# Patient Record
Sex: Female | Born: 1965 | Race: White | Hispanic: No | Smoking: Current every day smoker
Health system: Southern US, Community
[De-identification: ages and names within clinical notes are randomized; demographics above are authoritative.]

## PROBLEM LIST (undated history)

## (undated) DIAGNOSIS — F329 Major depressive disorder, single episode, unspecified: Secondary | ICD-10-CM

## (undated) DIAGNOSIS — J45909 Unspecified asthma, uncomplicated: Secondary | ICD-10-CM

## (undated) DIAGNOSIS — I10 Essential (primary) hypertension: Secondary | ICD-10-CM

## (undated) DIAGNOSIS — E119 Type 2 diabetes mellitus without complications: Secondary | ICD-10-CM

## (undated) DIAGNOSIS — F32A Depression, unspecified: Secondary | ICD-10-CM

## (undated) HISTORY — PX: TUBAL LIGATION: SHX77

## (undated) HISTORY — PX: APPENDECTOMY: SHX54

## (undated) HISTORY — PX: CHOLECYSTECTOMY: SHX55

---

## 2010-11-16 ENCOUNTER — Emergency Department (HOSPITAL_COMMUNITY)
Admission: EM | Admit: 2010-11-16 | Discharge: 2010-11-16 | Disposition: A | Discharge status: Home / Self Care | Payer: BC Managed Care – PPO | Attending: Emergency Medicine | Admitting: Emergency Medicine

## 2010-11-16 ENCOUNTER — Encounter: Payer: Self-pay | Admitting: *Deleted

## 2010-11-16 DIAGNOSIS — N390 Urinary tract infection, site not specified: Secondary | ICD-10-CM

## 2010-11-16 DIAGNOSIS — F17200 Nicotine dependence, unspecified, uncomplicated: Secondary | ICD-10-CM | POA: Insufficient documentation

## 2010-11-16 DIAGNOSIS — I10 Essential (primary) hypertension: Secondary | ICD-10-CM | POA: Insufficient documentation

## 2010-11-16 DIAGNOSIS — R51 Headache: Secondary | ICD-10-CM | POA: Insufficient documentation

## 2010-11-16 HISTORY — DX: Essential (primary) hypertension: I10

## 2010-11-16 LAB — URINALYSIS, ROUTINE W REFLEX MICROSCOPIC
Nitrite: NEGATIVE
Protein, ur: 30 mg/dL — AB
Specific Gravity, Urine: 1.025 (ref 1.005–1.030)
Urobilinogen, UA: 0.2 mg/dL (ref 0.0–1.0)

## 2010-11-16 LAB — URINE MICROSCOPIC-ADD ON

## 2010-11-16 MED ORDER — PHENAZOPYRIDINE HCL 200 MG PO TABS: 200.0000 mg | ORAL_TABLET | Freq: Three times a day (TID) | ORAL | Status: AC

## 2010-11-16 MED ORDER — CIPROFLOXACIN IN D5W 400 MG/200ML IV SOLN
400.0000 mg | Freq: Once | INTRAVENOUS | Status: AC
  Administered 2010-11-16: 400 mg via INTRAVENOUS
  Filled 2010-11-16: qty 200

## 2010-11-16 MED ORDER — PHENAZOPYRIDINE HCL 100 MG PO TABS
200.0000 mg | ORAL_TABLET | Freq: Once | ORAL | Status: AC
  Administered 2010-11-16: 200 mg via ORAL
  Filled 2010-11-16 (×2): qty 1

## 2010-11-16 MED ORDER — IBUPROFEN 400 MG PO TABS
600.0000 mg | ORAL_TABLET | Freq: Once | ORAL | Status: AC
  Administered 2010-11-16: 600 mg via ORAL
  Filled 2010-11-16: qty 2

## 2010-11-16 MED ORDER — CIPROFLOXACIN HCL 500 MG PO TABS: 500.0000 mg | ORAL_TABLET | Freq: Two times a day (BID) | ORAL | Status: AC

## 2010-11-16 NOTE — ED Notes (Signed)
Sudden onset of bladder pressure, urgency and bloody urine

## 2010-11-16 NOTE — ED Notes (Signed)
Pt left er stating no needs 

## 2010-11-16 NOTE — ED Provider Notes (Signed)
History     CSN: [id number] Arrival date & time: 11/16/2010  2:13 AM  Chief Complaint  Patient presents with  . Urinary Frequency   HPI Comments: Seen 0330. Patient with UTI symptoms that began day before yesterday. Worse last night.  Patient is a 45 y.o. female presenting with frequency. The history is provided by the patient.  Urinary Frequency This is a new problem. The current episode started 2 days ago. The problem occurs constantly. The problem has been gradually worsening. Associated symptoms include headaches. The symptoms are aggravated by nothing. The symptoms are relieved by nothing.    Past Medical History  Diagnosis Date  . Hypertension     Past Surgical History  Procedure Date  . Cholecystectomy   . Appendectomy   . Abdominal hysterectomy     No family history on file.  History  Substance Use Topics  . Smoking status: Current Everyday Smoker -- 1.0 packs/day  . Smokeless tobacco: Not on file  . Alcohol Use: No    OB History    Grav Para Term Preterm Abortions TAB SAB Ect Mult Living                  Review of Systems  Genitourinary: Positive for frequency.  Neurological: Positive for headaches.  All other systems reviewed and are negative.    Physical Exam  BP 143/91  Pulse 82  Temp(Src) 98.1 F (36.7 C) (Oral)  Resp 16  Ht 5\' 5"  (1.651 m)  Wt 190 lb (86.183 kg)  BMI 31.62 kg/m2  SpO2 97%  LMP 10/28/2010  Physical Exam  Vitals reviewed. Constitutional: She is oriented to person, place, and time. She appears well-developed and well-nourished.  HENT:  Head: Normocephalic and atraumatic.  Eyes: EOM are normal.  Cardiovascular: Normal rate, normal heart sounds and intact distal pulses.   Pulmonary/Chest: Effort normal and breath sounds normal.  Abdominal: Soft.  Musculoskeletal: Normal range of motion.  Neurological: She is alert and oriented to person, place, and time.  Skin: Skin is warm and dry.    ED Course   Procedures  Patient with UTI, started on antibiotics. Patient informed of clinical course, understand medical decision-making process, and agree with plan. MDM Reviewed: nursing note and vitals Interpretation: labs      Nicoletta Dress. Colon Branch, MD 11/16/10 [phone]

## 2012-03-25 ENCOUNTER — Encounter (HOSPITAL_COMMUNITY): Payer: Self-pay | Admitting: *Deleted

## 2012-03-25 ENCOUNTER — Emergency Department (HOSPITAL_COMMUNITY): Payer: BC Managed Care – PPO

## 2012-03-25 ENCOUNTER — Emergency Department (HOSPITAL_COMMUNITY)
Admission: EM | Admit: 2012-03-25 | Discharge: 2012-03-25 | Disposition: A | Discharge status: Home / Self Care | Payer: BC Managed Care – PPO | Attending: Emergency Medicine | Admitting: Emergency Medicine

## 2012-03-25 DIAGNOSIS — J9801 Acute bronchospasm: Secondary | ICD-10-CM

## 2012-03-25 DIAGNOSIS — J40 Bronchitis, not specified as acute or chronic: Secondary | ICD-10-CM

## 2012-03-25 DIAGNOSIS — Z79899 Other long term (current) drug therapy: Secondary | ICD-10-CM | POA: Insufficient documentation

## 2012-03-25 DIAGNOSIS — I10 Essential (primary) hypertension: Secondary | ICD-10-CM | POA: Insufficient documentation

## 2012-03-25 DIAGNOSIS — F329 Major depressive disorder, single episode, unspecified: Secondary | ICD-10-CM | POA: Insufficient documentation

## 2012-03-25 DIAGNOSIS — R062 Wheezing: Secondary | ICD-10-CM | POA: Insufficient documentation

## 2012-03-25 DIAGNOSIS — J209 Acute bronchitis, unspecified: Secondary | ICD-10-CM | POA: Insufficient documentation

## 2012-03-25 DIAGNOSIS — F17200 Nicotine dependence, unspecified, uncomplicated: Secondary | ICD-10-CM | POA: Insufficient documentation

## 2012-03-25 DIAGNOSIS — F3289 Other specified depressive episodes: Secondary | ICD-10-CM | POA: Insufficient documentation

## 2012-03-25 DIAGNOSIS — R059 Cough, unspecified: Secondary | ICD-10-CM | POA: Insufficient documentation

## 2012-03-25 DIAGNOSIS — R05 Cough: Secondary | ICD-10-CM

## 2012-03-25 HISTORY — DX: Depression, unspecified: F32.A

## 2012-03-25 HISTORY — DX: Major depressive disorder, single episode, unspecified: F32.9

## 2012-03-25 MED ORDER — BENZONATATE 100 MG PO CAPS
200.0000 mg | ORAL_CAPSULE | Freq: Once | ORAL | Status: AC
  Administered 2012-03-25: 200 mg via ORAL
  Filled 2012-03-25: qty 2

## 2012-03-25 MED ORDER — ALBUTEROL SULFATE (5 MG/ML) 0.5% IN NEBU
2.5000 mg | INHALATION_SOLUTION | Freq: Once | RESPIRATORY_TRACT | Status: AC
  Administered 2012-03-25: 2.5 mg via RESPIRATORY_TRACT
  Filled 2012-03-25: qty 0.5

## 2012-03-25 MED ORDER — PREDNISONE 10 MG PO TABS: 20.0000 mg | ORAL_TABLET | Freq: Every day | ORAL | Status: DC

## 2012-03-25 MED ORDER — GUAIFENESIN-CODEINE 100-10 MG/5ML PO SOLN
5.0000 mL | Freq: Once | ORAL | Status: AC
  Administered 2012-03-25: 5 mL via ORAL
  Filled 2012-03-25: qty 5

## 2012-03-25 MED ORDER — AZITHROMYCIN 250 MG PO TABS: ORAL_TABLET | ORAL | Status: DC

## 2012-03-25 MED ORDER — IPRATROPIUM BROMIDE 0.02 % IN SOLN
0.5000 mg | Freq: Once | RESPIRATORY_TRACT | Status: AC
  Administered 2012-03-25: 0.5 mg via RESPIRATORY_TRACT
  Filled 2012-03-25: qty 2.5

## 2012-03-25 MED ORDER — ALBUTEROL SULFATE HFA 108 (90 BASE) MCG/ACT IN AERS: 1.0000 | INHALATION_SPRAY | Freq: Four times a day (QID) | RESPIRATORY_TRACT | Status: DC | PRN

## 2012-03-25 MED ORDER — DEXTROMETHORPHAN HBR 15 MG/5ML PO SYRP: 10.0000 mL | ORAL_SOLUTION | Freq: Four times a day (QID) | ORAL | Status: DC | PRN

## 2012-03-25 MED ORDER — PREDNISONE 50 MG PO TABS
60.0000 mg | ORAL_TABLET | Freq: Once | ORAL | Status: AC
  Administered 2012-03-25: 60 mg via ORAL
  Filled 2012-03-25: qty 1

## 2012-03-25 MED ORDER — AZITHROMYCIN 250 MG PO TABS
500.0000 mg | ORAL_TABLET | Freq: Once | ORAL | Status: AC
  Administered 2012-03-25: 500 mg via ORAL
  Filled 2012-03-25: qty 2

## 2012-03-25 NOTE — ED Provider Notes (Signed)
History     CSN: [id number]  Arrival date & time 03/25/12  0243   First MD Initiated Contact with Patient 03/25/12 [phone]      Chief Complaint  Patient presents with  . Shortness of Breath    (Consider location/radiation/quality/duration/timing/severity/associated sxs/prior treatment) HPI Krista Bowen is a 47 y.o. female who presents to the Emergency Department complaining of increasing shortness of breath associated with a cough for the last three days. She has used albuterol left over from a previous URI with no improvement. Tonight she became more short of breath associated with wheezing. She tried steam in the bathroom, went outside in the cold air neither of which made her breathing better. Denies fever, chills, nausea, vomiting.   PCP Dr. Loney Hering Past Medical History  Diagnosis Date  . Hypertension   . Depression     Past Surgical History  Procedure Date  . Cholecystectomy   . Appendectomy   . Tubal ligation     History reviewed. No pertinent family history.  History  Substance Use Topics  . Smoking status: Current Every Day Smoker -- 1.0 packs/day  . Smokeless tobacco: Not on file  . Alcohol Use: No    OB History    Grav Para Term Preterm Abortions TAB SAB Ect Mult Living                  Review of Systems  Constitutional: Negative for fever.       10 Systems reviewed and are negative for acute change except as noted in the HPI.  HENT: Negative for congestion.   Eyes: Negative for discharge and redness.  Respiratory: Positive for cough, shortness of breath and wheezing.   Cardiovascular: Negative for chest pain.  Gastrointestinal: Negative for vomiting and abdominal pain.  Musculoskeletal: Negative for back pain.  Skin: Negative for rash.  Neurological: Negative for syncope, numbness and headaches.  Psychiatric/Behavioral:       No behavior change.    Allergies  Review of patient's allergies indicates no known allergies.  Home Medications    Current Outpatient Rx  Name  Route  Sig  Dispense  Refill  . LOSARTAN POTASSIUM-HCTZ 100-25 MG PO TABS   Oral   Take 1 tablet by mouth daily.         Marland Kitchen ALPRAZOLAM 0.5 MG PO TABS   Oral   Take 0.5 mg by mouth at bedtime as needed.           . SERTRALINE HCL 100 MG PO TABS   Oral   Take 100 mg by mouth daily.             BP 132/73  Pulse 97  Temp 98.4 F (36.9 C) (Oral)  Resp 20  Ht 5\' 5"  (1.651 m)  Wt 192 lb (87.091 kg)  BMI 31.95 kg/m2  SpO2 96%  LMP 02/23/2012  Physical Exam  Nursing note and vitals reviewed. Constitutional:       Awake, alert, nontoxic appearance.  HENT:  Head: Atraumatic.  Eyes: Right eye exhibits no discharge. Left eye exhibits no discharge.  Neck: Neck supple.  Pulmonary/Chest: She has wheezes. She exhibits no tenderness.       Increased effort, unable to talk in complete sentences.  Abdominal: Soft. There is no tenderness. There is no rebound.  Musculoskeletal: She exhibits no tenderness.       Baseline ROM, no obvious new focal weakness.  Neurological:       Mental status and motor strength appears baseline  for patient and situation.  Skin: No rash noted.  Psychiatric: She has a normal mood and affect.    ED Course  Procedures (including critical care time)  Dg Chest 2 View  03/25/2012  *RADIOLOGY REPORT*  Clinical Data: Cough and shortness of breath for 3 days.  CHEST - 2 VIEW  Comparison: 12/19/2011  Findings: Interval development of linear opacity in the left mid lung suggesting linear atelectasis.  Peribronchial thickening suggesting chronic bronchitis.  Mild hyperinflation. The heart size and pulmonary vascularity are normal. The lungs appear clear and expanded without focal air space disease or consolidation. No blunting of the costophrenic angles.  IMPRESSION: Developing linear atelectasis in the left mid lung.   Original Report Authenticated By: Burman Nieves, M.D.     MDM  Patient with cough and SOB x 3 days. Xray  without acute findings however supporting bronchospasm. Given nebulizer treatments x 2, prednisone, cough medicine and tessalon pereles with improvement. Occasional cough. Residual cough. Reviewed results.  Pt feels improved after observation and/or treatment in ED.Pt stable in ED with no significant deterioration in condition.The patient appears reasonably screened and/or stabilized for discharge and I doubt any other medical condition or other [facility] requiring further screening, evaluation, or treatment in the ED at this time prior to discharge.  MDM Reviewed: nursing note and vitals Interpretation: x-ray         Nicoletta Dress. Colon Branch, MD 03/25/12 [phone]

## 2012-03-25 NOTE — ED Notes (Signed)
Pt reporting cough and SOB for 3 days.  Denies cough being productive.  Reports taking OTC cold medication, as well as an inhaler with no relief.

## 2012-03-25 NOTE — ED Notes (Signed)
Pt discharged. Pt stable at time of discharge. Medications reviewed pt has no questions regarding discharge at this time. Pt voiced understanding of discharge instructions.  

## 2016-03-02 ENCOUNTER — Encounter (HOSPITAL_COMMUNITY): Payer: Self-pay

## 2016-03-02 ENCOUNTER — Emergency Department (HOSPITAL_COMMUNITY)
Admission: EM | Admit: 2016-03-02 | Discharge: 2016-03-02 | Disposition: A | Discharge status: Home / Self Care | Payer: BLUE CROSS/BLUE SHIELD | Attending: Emergency Medicine | Admitting: Emergency Medicine

## 2016-03-02 DIAGNOSIS — Z79899 Other long term (current) drug therapy: Secondary | ICD-10-CM | POA: Diagnosis not present

## 2016-03-02 DIAGNOSIS — L500 Allergic urticaria: Secondary | ICD-10-CM | POA: Diagnosis not present

## 2016-03-02 DIAGNOSIS — T7840XD Allergy, unspecified, subsequent encounter: Secondary | ICD-10-CM

## 2016-03-02 DIAGNOSIS — I10 Essential (primary) hypertension: Secondary | ICD-10-CM | POA: Insufficient documentation

## 2016-03-02 DIAGNOSIS — L509 Urticaria, unspecified: Secondary | ICD-10-CM

## 2016-03-02 DIAGNOSIS — T7840XA Allergy, unspecified, initial encounter: Secondary | ICD-10-CM | POA: Diagnosis present

## 2016-03-02 DIAGNOSIS — F17200 Nicotine dependence, unspecified, uncomplicated: Secondary | ICD-10-CM | POA: Diagnosis not present

## 2016-03-02 NOTE — ED Triage Notes (Signed)
Patient reports of itching, swelling to hands and woke up with difficulty breathing this morning at 0300. Went to urgent care had breathing treatment, solumedrol and 50mg  of benadryl po. Patient Denies respiratory issues at this time. Patient has been on Augmentin for 10 days.

## 2016-03-02 NOTE — ED Notes (Addendum)
Pt left without discharge papers. EDP reviewed discharged and education. IV access. No VS taken prior to leaving.

## 2016-03-02 NOTE — ED Notes (Signed)
ED Provider at bedside. 

## 2016-03-04 NOTE — ED Provider Notes (Signed)
[facility]   CSN: [id number] Arrival date & time: 03/02/16  1315     History   Chief Complaint Chief Complaint  Patient presents with  . Allergic Reaction    HPI Krista CroftBarbara Bowen is a 50 y.o. female.  HPI   50yF with allergic reaction. She woke up around 3:00 this morning with diffuse itching, mild swelling her hands and face and some difficulty breathing. She went to urgent care and was given a breathing treatment, Solu-Medrol and Benadryl. She was referred to the emergency room for further evaluation. Since being seen at [facility], her symptoms have significantly improved. Mild facial swelling. Rash dyspnea/wheezing. No further gi complaints. No dizziness or lightheadedness.   Past Medical History:  Diagnosis Date  . Depression   . Hypertension     There are no active problems to display for this patient.   Past Surgical History:  Procedure Laterality Date  . APPENDECTOMY    . CHOLECYSTECTOMY    . TUBAL LIGATION      OB History    No data available       Home Medications    Prior to Admission medications   Medication Sig Start Date End Date Taking? Authorizing Provider  albuterol (PROVENTIL HFA;VENTOLIN HFA) 108 (90 BASE) MCG/ACT inhaler Inhale 1-2 puffs into the lungs every 6 (six) hours as needed for wheezing. 03/25/12   Annamarie Dawleyerry S Strand, MD  ALPRAZolam Prudy Feeler(XANAX) 0.5 MG tablet Take 0.5 mg by mouth at bedtime as needed.      Historical Provider, MD  azithromycin (ZITHROMAX Z-PAK) 250 MG tablet One Po daily 03/25/12   Annamarie Dawleyerry S Strand, MD  dextromethorphan 15 MG/5ML syrup Take 10 mLs (30 mg total) by mouth 4 (four) times daily as needed for cough. 03/25/12   Annamarie Dawleyerry S Strand, MD  losartan-hydrochlorothiazide (HYZAAR) 100-25 MG per tablet Take 1 tablet by mouth daily.    Historical Provider, MD  predniSONE (DELTASONE) 10 MG tablet Take 2 tablets (20 mg total) by mouth daily. 03/25/12   Annamarie Dawleyerry S Strand, MD  sertraline (ZOLOFT) 100 MG tablet Take 100 mg by  mouth daily.      Historical Provider, MD    Family History No family history on file.  Social History Social History  Substance Use Topics  . Smoking status: Current Every Day Smoker    Packs/day: 1.00  . Smokeless tobacco: Never Used  . Alcohol use No     Allergies   Patient has no known allergies.   Review of Systems Review of Systems  All systems reviewed and negative, other than as noted in HPI.  Physical Exam Updated Vital Signs BP 117/79 (BP Location: Left Arm)   Pulse 100   Temp 97.8 F (36.6 C) (Oral)   Resp 18   Ht 5\' 5"  (1.651 m)   Wt 200 lb (90.7 kg)   LMP 03/02/2016 (LMP Unknown)   SpO2 97%   BMI 33.28 kg/m   Physical Exam  Constitutional: She is oriented to person, place, and time. She appears well-developed and well-nourished. No distress.  HENT:  Head: Normocephalic and atraumatic.  Mild facial puffiness. Normal sounding voice. No stridor.   Eyes: Conjunctivae are normal. Pupils are equal, round, and reactive to light. Right eye exhibits no discharge. Left eye exhibits no discharge.  Neck: Neck supple.  Cardiovascular: Normal rate, regular rhythm and normal heart sounds.  Exam reveals no gallop and no friction rub.   No murmur heard. Pulmonary/Chest: Effort normal and breath sounds  normal. No respiratory distress.  Abdominal: Soft. She exhibits no distension. There is no tenderness.  Musculoskeletal: She exhibits no edema or tenderness.  Neurological: She is alert and oriented to person, place, and time.  Skin: Skin is warm and dry. No rash noted.  Psychiatric: She has a normal mood and affect. Her behavior is normal. Thought content normal.  Nursing note and vitals reviewed.    ED Treatments / Results  Labs (all labs ordered are listed, but only abnormal results are displayed) Labs Reviewed - No data to display  EKG  EKG Interpretation None       Radiology No results found.  Procedures Procedures (including critical care  time)  Medications Ordered in ED Medications - No data to display   Initial Impression / Assessment and Plan / ED Course  I have reviewed the triage vital signs and the nursing notes.  Pertinent labs & imaging results that were available during my care of the patient were reviewed by me and considered in my medical decision making (see chart for details).  Clinical Course    50 year old female wling. Se she has no acute respiratory complaints. Some mild nausea previously which has since resolved.en in urgent care prior to arrival to the emergency room. She reports that her symptoms have significantly improved since then.On exam she has no acute distress. Her lungs are clear no increased work of breathing. She is hemodynamically stable. At this point I feel she is appropriate for discharge.  . As needed Benadryl.   Final Clinical Impressions(s) / ED Diagnoses   Final diagnoses:  Allergic reaction, subsequent encounter  Urticaria    New Prescriptions Discharge Medication List as of 03/02/2016  2:43 PM       Raeford RazorStephen Robbin Escher, MD 03/04/16 1511

## 2019-03-31 ENCOUNTER — Other Ambulatory Visit: Payer: Self-pay

## 2019-03-31 ENCOUNTER — Emergency Department (HOSPITAL_COMMUNITY): Payer: BC Managed Care – PPO

## 2019-03-31 ENCOUNTER — Encounter (HOSPITAL_COMMUNITY): Payer: Self-pay | Admitting: *Deleted

## 2019-03-31 ENCOUNTER — Ambulatory Visit (INDEPENDENT_AMBULATORY_CARE_PROVIDER_SITE_OTHER)
Admission: EM | Admit: 2019-03-31 | Discharge: 2019-03-31 | Disposition: A | Discharge status: Home / Self Care | Payer: BC Managed Care – PPO | Source: Home / Self Care

## 2019-03-31 ENCOUNTER — Emergency Department (HOSPITAL_COMMUNITY)
Admission: EM | Admit: 2019-03-31 | Discharge: 2019-03-31 | Disposition: A | Discharge status: Home / Self Care | Payer: BC Managed Care – PPO | Attending: Emergency Medicine | Admitting: Emergency Medicine

## 2019-03-31 DIAGNOSIS — R110 Nausea: Secondary | ICD-10-CM | POA: Insufficient documentation

## 2019-03-31 DIAGNOSIS — R519 Headache, unspecified: Secondary | ICD-10-CM

## 2019-03-31 DIAGNOSIS — G43901 Migraine, unspecified, not intractable, with status migrainosus: Secondary | ICD-10-CM | POA: Diagnosis not present

## 2019-03-31 DIAGNOSIS — Z7984 Long term (current) use of oral hypoglycemic drugs: Secondary | ICD-10-CM | POA: Diagnosis not present

## 2019-03-31 DIAGNOSIS — R030 Elevated blood-pressure reading, without diagnosis of hypertension: Secondary | ICD-10-CM

## 2019-03-31 DIAGNOSIS — I10 Essential (primary) hypertension: Secondary | ICD-10-CM | POA: Diagnosis not present

## 2019-03-31 DIAGNOSIS — R739 Hyperglycemia, unspecified: Secondary | ICD-10-CM

## 2019-03-31 DIAGNOSIS — F17210 Nicotine dependence, cigarettes, uncomplicated: Secondary | ICD-10-CM | POA: Diagnosis not present

## 2019-03-31 DIAGNOSIS — Z79899 Other long term (current) drug therapy: Secondary | ICD-10-CM | POA: Insufficient documentation

## 2019-03-31 LAB — BASIC METABOLIC PANEL
Anion gap: 10 (ref 5–15)
BUN: 15 mg/dL (ref 6–20)
CO2: 24 mmol/L (ref 22–32)
Calcium: 9.4 mg/dL (ref 8.9–10.3)
Chloride: 100 mmol/L (ref 98–111)
Creatinine, Ser: 0.8 mg/dL (ref 0.44–1.00)
GFR calc Af Amer: >60 mL/min (ref >60)
GFR calc non Af Amer: >60 mL/min (ref >60)
Glucose, Bld: 161 mg/dL — ABNORMAL HIGH (ref 70–99)
Potassium: 3.9 mmol/L (ref 3.5–5.1)
Sodium: 134 mmol/L — ABNORMAL LOW (ref 135–145)

## 2019-03-31 LAB — CBC
HCT: 45.7 % (ref 36.0–46.0)
Hemoglobin: 15.4 g/dL — ABNORMAL HIGH (ref 12.0–15.0)
MCH: 30.7 pg (ref 26.0–34.0)
MCHC: 33.7 g/dL (ref 30.0–36.0)
MCV: 91.2 fL (ref 80.0–100.0)
Platelets: 216 10*3/uL (ref 150–400)
RBC: 5.01 MIL/uL (ref 3.87–5.11)
RDW: 13.2 % (ref 11.5–15.5)
WBC: 7.3 10*3/uL (ref 4.0–10.5)
nRBC: 0 % (ref 0.0–0.2)

## 2019-03-31 MED ORDER — MORPHINE SULFATE (PF) 4 MG/ML IV SOLN
4.0000 mg | Freq: Once | INTRAVENOUS | Status: AC
  Administered 2019-03-31: 4 mg via INTRAVENOUS
  Filled 2019-03-31: qty 1

## 2019-03-31 MED ORDER — DIPHENHYDRAMINE HCL 50 MG/ML IJ SOLN
12.5000 mg | Freq: Once | INTRAMUSCULAR | Status: AC
  Administered 2019-03-31: 12.5 mg via INTRAVENOUS
  Filled 2019-03-31: qty 1

## 2019-03-31 MED ORDER — ACETAMINOPHEN 325 MG PO TABS
650.0000 mg | ORAL_TABLET | Freq: Once | ORAL | Status: AC
  Administered 2019-03-31: 08:00:00 650 mg via ORAL

## 2019-03-31 MED ORDER — BUTALBITAL-APAP-CAFFEINE 50-325-40 MG PO TABS: 1.0000 | ORAL_TABLET | Freq: Four times a day (QID) | ORAL | 0 refills | Status: AC | PRN

## 2019-03-31 MED ORDER — PROCHLORPERAZINE EDISYLATE 10 MG/2ML IJ SOLN
10.0000 mg | Freq: Once | INTRAMUSCULAR | Status: AC
  Administered 2019-03-31: 09:00:00 10 mg via INTRAVENOUS
  Filled 2019-03-31: qty 2

## 2019-03-31 MED ORDER — ONDANSETRON 4 MG PO TBDP
4.0000 mg | ORAL_TABLET | Freq: Once | ORAL | Status: AC
  Administered 2019-03-31: 08:00:00 4 mg via ORAL

## 2019-03-31 NOTE — ED Triage Notes (Signed)
Pt presents with c/o headache. Pt states she has had headache since the 1st of January but became severe in ;ast 3 days and radiating to left face and left side of neck

## 2019-03-31 NOTE — ED Triage Notes (Signed)
Patient presents to the ED after urgent care evaluation of headache and hypertension.  Patient complains of left side headache with nausea for 3 days.  Patient states she has taken at least 30  200mg  tablets of motrin in addition to "4 asprins" in the last 24 hours.

## 2019-03-31 NOTE — Discharge Instructions (Signed)
Recommending further evaluation and management in the ED for worst headache of life. Cannot rule out brain bleed in Urgent Care setting. Patient and family member aware.  Will travel by private vehicle to [facility] ED.  Patient given in tylenol 650 mg and zofran 4 mg.  Patient discharged in stable condition.

## 2019-03-31 NOTE — ED Provider Notes (Signed)
[facility]   [id number] 03/31/19 Arrival Time: 0803  CC: HEADACHE  SUBJECTIVE:  Krista Bowen is a 54 y.o. female who complains of worst headache of life.  Has had a headache/ migraine x 1 month, but worsened over the past 3 days.  Denies a precipitating event, or recent head trauma.  Patient localizes her pain to the LT side of face.  Describes the pain as constant and aching in character.  9/10.  Patient has tried taking "30 ibuprofen" since 3 am this morning without relief. Denies aggravating factors.  This is the worst headache of their life.  Complains of associated nausea.  Patient denies fever, chills, night sweats, vomiting, vision changes, diplopia, peripheral vision deficits, chest pain, SOB, abdominal pain, weakness, numbness or tingling, slurred speech, facial droop.    ROS: As per HPI.  All other pertinent ROS negative.     Past Medical History:  Diagnosis Date  . Depression   . Hypertension    Past Surgical History:  Procedure Laterality Date  . APPENDECTOMY    . CHOLECYSTECTOMY    . TUBAL LIGATION     Allergies  Allergen Reactions  . Augmentin [Amoxicillin-Pot Clavulanate] Hives   No current facility-administered medications on file prior to encounter.   Current Outpatient Medications on File Prior to Encounter  Medication Sig Dispense Refill  . albuterol (PROVENTIL HFA;VENTOLIN HFA) 108 (90 BASE) MCG/ACT inhaler Inhale 1-2 puffs into the lungs every 6 (six) hours as needed for wheezing. 1 Inhaler 0  . ALPRAZolam (XANAX) 0.5 MG tablet Take 0.5 mg by mouth at bedtime as needed.      Marland Kitchen dextromethorphan 15 MG/5ML syrup Take 10 mLs (30 mg total) by mouth 4 (four) times daily as needed for cough. 120 mL 0  . losartan-hydrochlorothiazide (HYZAAR) 100-25 MG per tablet Take 1 tablet by mouth daily.    . metFORMIN (GLUCOPHAGE) 500 MG tablet Take 500 mg by mouth 2 (two) times daily.    . sertraline (ZOLOFT) 100 MG tablet Take 100 mg by mouth daily.        Social History   Socioeconomic History  . Marital status: Married    Spouse name: Not on file  . Number of children: Not on file  . Years of education: Not on file  . Highest education level: Not on file  Occupational History  . Not on file  Tobacco Use  . Smoking status: Current Every Day Smoker    Packs/day: 1.00  . Smokeless tobacco: Never Used  Substance and Sexual Activity  . Alcohol use: No  . Drug use: No  . Sexual activity: Not on file  Other Topics Concern  . Not on file  Social History Narrative  . Not on file   Social Determinants of Health   Financial Resource Strain:   . Difficulty of Paying Living Expenses: Not on file  Food Insecurity:   . Worried About Programme researcher, broadcasting/film/video in the Last Year: Not on file  . Ran Out of Food in the Last Year: Not on file  Transportation Needs:   . Lack of Transportation (Medical): Not on file  . Lack of Transportation (Non-Medical): Not on file  Physical Activity:   . Days of Exercise per Week: Not on file  . Minutes of Exercise per Session: Not on file  Stress:   . Feeling of Stress : Not on file  Social Connections:   . Frequency of Communication with Friends and Family: Not on file  .  Frequency of Social Gatherings with Friends and Family: Not on file  . Attends Religious Services: Not on file  . Active Member of Clubs or Organizations: Not on file  . Attends Archivist Meetings: Not on file  . Marital Status: Not on file  Intimate Partner Violence:   . Fear of Current or Ex-Partner: Not on file  . Emotionally Abused: Not on file  . Physically Abused: Not on file  . Sexually Abused: Not on file   Family History  Problem Relation Age of Onset  . Diabetes Mother   . Hypertension Mother   . Stroke Mother   . Hypertension Father   . Stroke Father   . Heart failure Father     OBJECTIVE:  Vitals:   03/31/19 0814  BP: (!) 150/102  Pulse: 83  Temp: 98.4 F (36.9 C)  SpO2: 97%    General  appearance: alert; appears uncomfortable Eyes: PERRLA; EOMI HENT: normocephalic; atraumatic Neck: supple with FROM Lungs: clear to auscultation bilaterally Heart: regular rate and rhythm.   Extremities: no edema; symmetrical with no gross deformities Skin: warm and dry Neurologic: CN 2-12 grossly intact; finger to nose without difficulty; normal gait; strength and sensation intact bilaterally about the upper and lower extremities; negative pronator drift Psychological: alert and cooperative; normal mood and affect  ASSESSMENT & PLAN:  1. Worst headache of life   2. Elevated blood pressure reading     Meds ordered this encounter  Medications  . acetaminophen (TYLENOL) tablet 650 mg  . ondansetron (ZOFRAN-ODT) disintegrating tablet 4 mg   Recommending further evaluation and management in the ED for worst headache of life. Cannot rule out brain bleed in Urgent Care setting. Patient and family member aware.  Will travel by private vehicle to [facility] ED.  Patient given in tylenol 650 mg and zofran 4 mg.  Patient discharged in stable condition.     Stacey Drain Acequia, PA-C 03/31/19 [phone]

## 2019-03-31 NOTE — Discharge Instructions (Addendum)
Take the medications as needed for recurrent headache.  Follow-up with your primary care doctor as we discussed.  Return to the ED for worsening symptoms, numbness, weakness, or slurred speech

## 2019-03-31 NOTE — ED Provider Notes (Signed)
[facility] EMERGENCY DEPARTMENT Provider Note   CSN: [id number] Arrival date & time: 03/31/19  6237     History Chief Complaint  Patient presents with  . Migraine    Krista Bowen is a 54 y.o. female.  HPI   Patient presents ED for evaluation of a headache.  Patient states this has been going off and on for the past month.  No acute thunderclap onset but the symptoms have been gradually getting more severe.  In the last day or so it has been more intense and is now a 9 out of 10 in severity.  She has not had headaches this bad before.  She has been taking ibuprofen, more than recommended, she thinks maybe 30.  The headache is on the left side of her eye goes down the left side of her face to neck.  She denies having any numbness or weakness.  No trouble with her speech.  No fevers or chills.  She has had some nausea with this.  Patient does have history of migraines but has not had one like this in a while.  She went to an urgent care today who suggested she come to the ED.  Past Medical History:  Diagnosis Date  . Depression   . Hypertension     There are no problems to display for this patient.   Past Surgical History:  Procedure Laterality Date  . APPENDECTOMY    . CHOLECYSTECTOMY    . TUBAL LIGATION       OB History   No obstetric history on file.     Family History  Problem Relation Age of Onset  . Diabetes Mother   . Hypertension Mother   . Stroke Mother   . Hypertension Father   . Stroke Father   . Heart failure Father     Social History   Tobacco Use  . Smoking status: Current Every Day Smoker    Packs/day: 1.00  . Smokeless tobacco: Never Used  Substance Use Topics  . Alcohol use: No  . Drug use: No    Home Medications Prior to Admission medications   Medication Sig Start Date End Date Taking? Authorizing Provider  albuterol (PROVENTIL HFA;VENTOLIN HFA) 108 (90 BASE) MCG/ACT inhaler Inhale 1-2 puffs into the lungs every 6 (six) hours as  needed for wheezing. 03/25/12   Annamarie Dawley, MD  ALPRAZolam Prudy Feeler) 0.5 MG tablet Take 0.5 mg by mouth at bedtime as needed.      [provider]  butalbital-acetaminophen-caffeine (FIORICET) [phone] MG tablet Take 1-2 tablets by mouth every 6 (six) hours as needed for headache. 03/31/19 03/30/20  Linwood Dibbles, MD  dextromethorphan 15 MG/5ML syrup Take 10 mLs (30 mg total) by mouth 4 (four) times daily as needed for cough. Patient not taking: Reported on 03/31/2019 03/25/12   Annamarie Dawley, MD  glimepiride (AMARYL) 4 MG tablet Take 4 mg by mouth 2 (two) times daily. 03/15/19   [provider]  losartan-hydrochlorothiazide (HYZAAR) 100-25 MG per tablet Take 1 tablet by mouth daily.    [provider]  metFORMIN (GLUCOPHAGE-XR) 500 MG 24 hr tablet Take 1,000 mg by mouth 2 (two) times daily. 03/15/19   [provider]  potassium chloride SA (KLOR-CON) 20 MEQ tablet Take 20 mEq by mouth 2 (two) times daily. 03/15/19   [provider]  RYBELSUS 7 MG TABS Take 1 tablet by mouth every morning. 03/07/19   [provider]  sertraline (ZOLOFT) 100 MG tablet Take  100 mg by mouth daily.      [provider]    Allergies    Augmentin [amoxicillin-pot clavulanate]  Review of Systems   Review of Systems  All other systems reviewed and are negative.   Physical Exam Updated Vital Signs BP (!) 166/77 (BP Location: Right Arm)   Pulse 72   Temp 98.1 F (36.7 C) (Oral)   Resp 14   Ht 1.626 m (5\' 4" )   Wt 83.5 kg   LMP 03/02/2016 (LMP Unknown)   SpO2 100%   BMI 31.58 kg/m   Physical Exam Vitals and nursing note reviewed.  Constitutional:      General: She is not in acute distress.    Appearance: She is well-developed.  HENT:     Head: Normocephalic and atraumatic.     Right Ear: Tympanic membrane and external ear normal.     Left Ear: Tympanic membrane and external ear normal.  Eyes:     General: No visual field deficit or scleral  icterus.       Right eye: No discharge.        Left eye: No discharge.     Conjunctiva/sclera: Conjunctivae normal.  Neck:     Trachea: No tracheal deviation.  Cardiovascular:     Rate and Rhythm: Normal rate and regular rhythm.  Pulmonary:     Effort: Pulmonary effort is normal. No respiratory distress.     Breath sounds: Normal breath sounds. No stridor. No wheezing or rales.  Abdominal:     General: Bowel sounds are normal. There is no distension.     Palpations: Abdomen is soft.     Tenderness: There is no abdominal tenderness. There is no guarding or rebound.  Musculoskeletal:        General: No tenderness.     Cervical back: Normal range of motion and neck supple. No rigidity.  Skin:    General: Skin is warm and dry.     Findings: No rash.  Neurological:     Mental Status: She is alert.     Cranial Nerves: No cranial nerve deficit (no facial droop, extraocular movements intact, no slurred speech).     Sensory: No sensory deficit.     Motor: Motor function is intact. No weakness, abnormal muscle tone or seizure activity.     Coordination: Coordination normal.     Comments: No pronator drift, able to lift both legs off the bed     ED Results / Procedures / Treatments   Labs (all labs ordered are listed, but only abnormal results are displayed) Labs Reviewed  CBC - Abnormal; Notable for the following components:      Result Value   Hemoglobin 15.4 (*)    All other components within normal limits  BASIC METABOLIC PANEL - Abnormal; Notable for the following components:   Sodium 134 (*)    Glucose, Bld 161 (*)    All other components within normal limits    EKG None  Radiology CT Head Wo Contrast  Result Date: 03/31/2019 CLINICAL DATA:  Headache rule out hemorrhage EXAM: CT HEAD WITHOUT CONTRAST TECHNIQUE: Contiguous axial images were obtained from the base of the skull through the vertex without intravenous contrast. COMPARISON:  None. FINDINGS: Brain: No evidence  of acute infarction, hemorrhage, hydrocephalus, extra-axial collection or mass lesion/mass effect. Vascular: Negative for hyperdense vessel Skull: Negative Sinuses/Orbits: Negative Other: None IMPRESSION: Negative CT head Electronically Signed   By: Franchot Gallo M.D.   On: 03/31/2019 09:43  Procedures Procedures (including critical care time)  Medications Ordered in ED Medications  prochlorperazine (COMPAZINE) injection 10 mg (10 mg Intravenous Given 03/31/19 0915)  morphine 4 MG/ML injection 4 mg (4 mg Intravenous Given 03/31/19 0918)  diphenhydrAMINE (BENADRYL) injection 12.5 mg (12.5 mg Intravenous Given 03/31/19 0914)    ED Course  I have reviewed the triage vital signs and the nursing notes.  Pertinent labs & imaging results that were available during my care of the patient were reviewed by me and considered in my medical decision making (see chart for details).  Clinical Course as of Mar 30 1010  Sun Mar 31, 2019  1001 Labs reviewed.  CT scan scan without acute findings.  Hyperglycemia noted   [JK]  1012 Patient is feeling better now after treatment   [JK]    Clinical Course User Index [JK] Linwood Dibbles, MD   MDM Rules/Calculators/A&P                      Symptoms are consistent with a migraine headache.  No thunderclap onset.  Head CT is negative.  No signs of hemorrhage.  I doubt subarachnoid hemorrhage..  Will discharge home.  Warning signs and precautions discussed.  Discussed outpatient follow-up.  H Patient does have history of migraines.  She improved with treatment in the ED Final Clinical Impression(s) / ED Diagnoses Final diagnoses:  Hyperglycemia  Migraine with status migrainosus, not intractable, unspecified migraine type    Rx / DC Orders ED Discharge Orders         Ordered    butalbital-acetaminophen-caffeine (FIORICET) 50-325-40 MG tablet  Every 6 hours PRN     03/31/19 1007           Linwood Dibbles, MD 03/31/19 1013

## 2020-02-07 ENCOUNTER — Other Ambulatory Visit: Payer: Self-pay | Admitting: Internal Medicine

## 2020-02-07 DIAGNOSIS — Z1231 Encounter for screening mammogram for malignant neoplasm of breast: Secondary | ICD-10-CM

## 2020-02-10 ENCOUNTER — Other Ambulatory Visit: Payer: Self-pay

## 2020-02-10 ENCOUNTER — Ambulatory Visit
Admission: RE | Admit: 2020-02-10 | Discharge: 2020-02-10 | Disposition: A | Discharge status: Home / Self Care | Payer: BC Managed Care – PPO | Source: Ambulatory Visit | Attending: Internal Medicine | Admitting: Internal Medicine

## 2020-02-10 DIAGNOSIS — Z1231 Encounter for screening mammogram for malignant neoplasm of breast: Secondary | ICD-10-CM

## 2020-07-30 IMAGING — CT CT HEAD W/O CM
3 series · 16 of 47 positions shown, 19 images · non-contrast
Comparison: None.

CLINICAL DATA: Headache rule out hemorrhage

EXAM:
CT HEAD WITHOUT CONTRAST
TECHNIQUE: Contiguous axial images were obtained from the base of the skull
through the vertex without intravenous contrast.

[Series 2: head w o · axial · 0.46mm/px · z∈[-5,+120]mm · 10 of 30 slices shown, 13 images]
[im 3/30  brain]
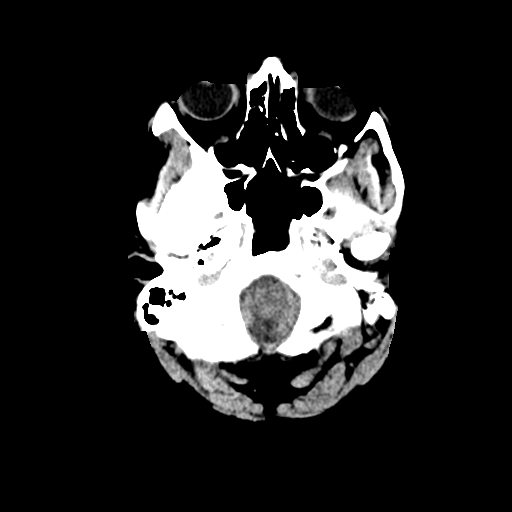
[im 3/30  bone]
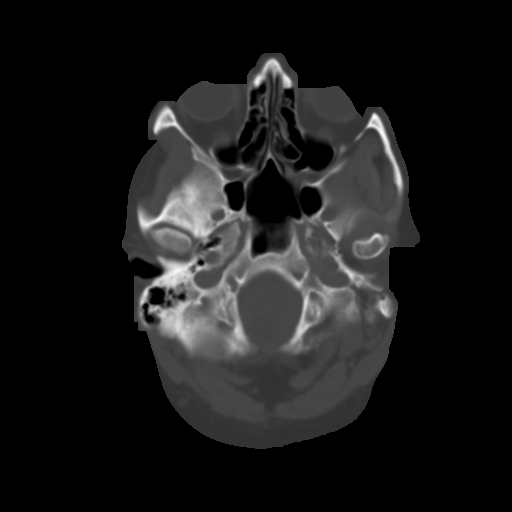
[im 6/30  brain]
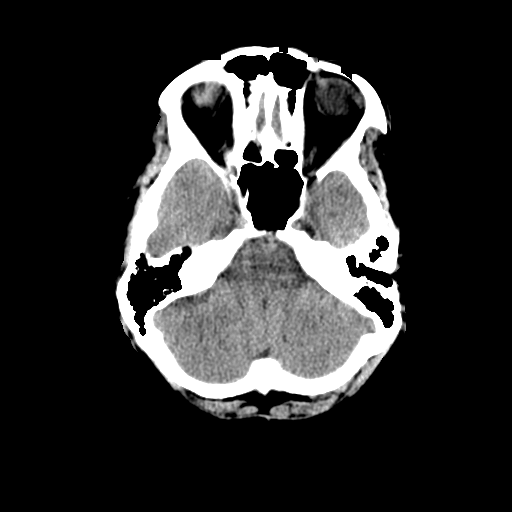
[im 9/30  brain]
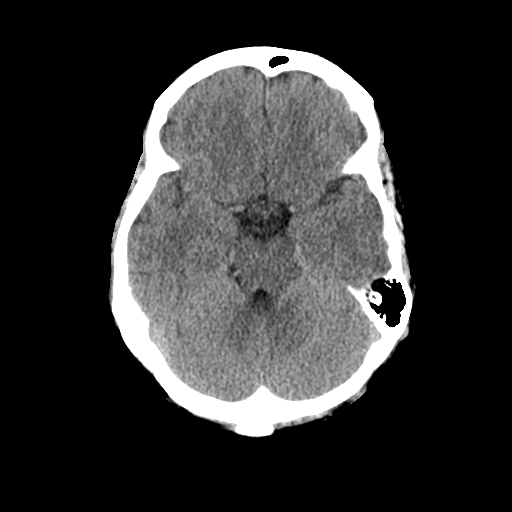
[im 11/30  brain]
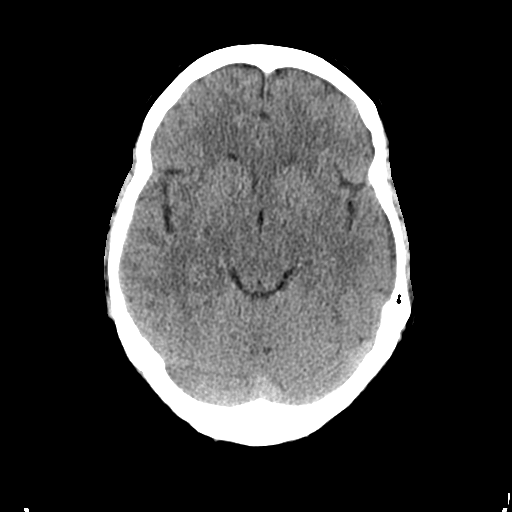
[im 14/30  brain]
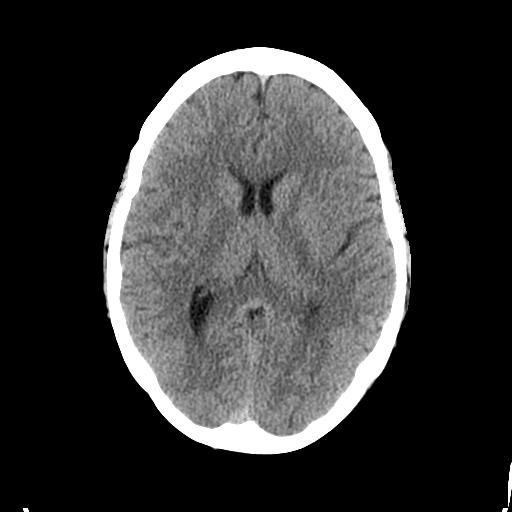
[im 14/30  bone]
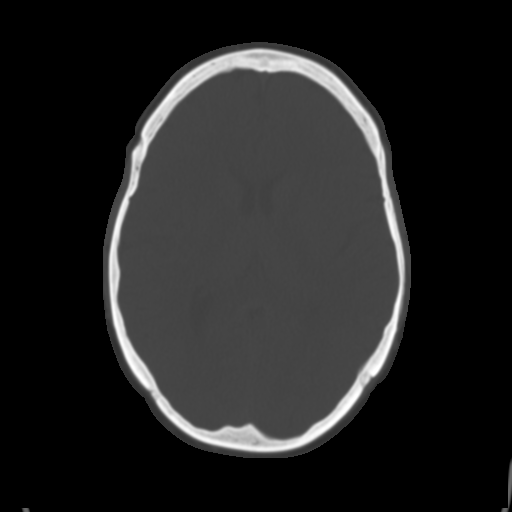
[im 17/30  brain]
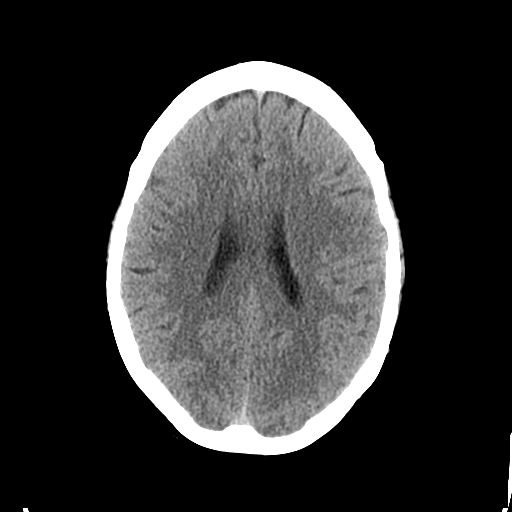
[im 20/30  brain]
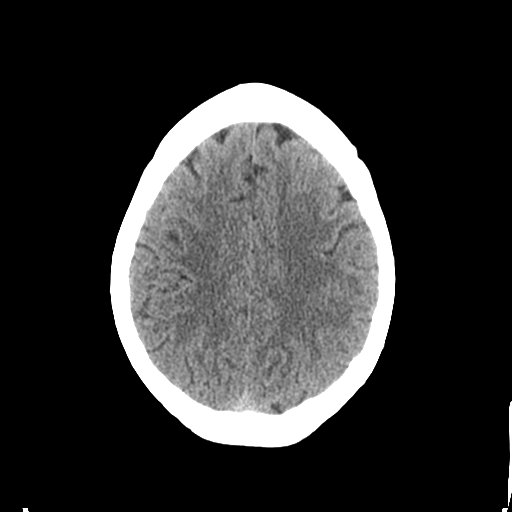
[im 23/30  brain]
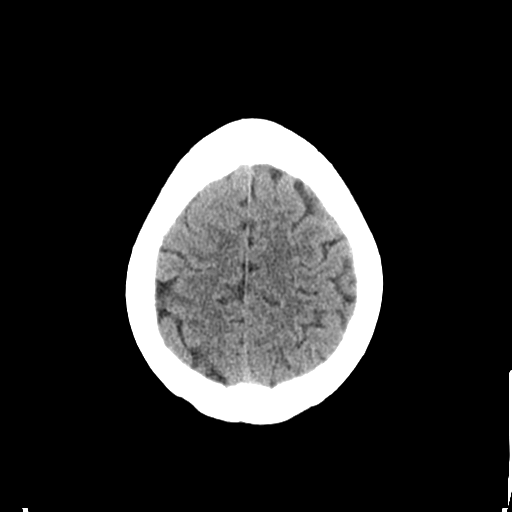
[im 25/30  brain]
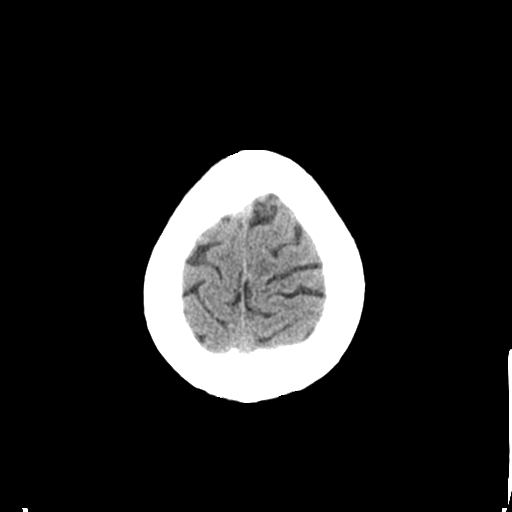
[im 25/30  bone]
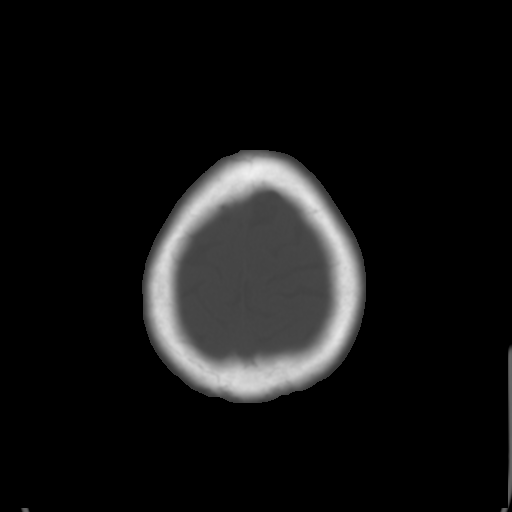
[im 28/30  brain]
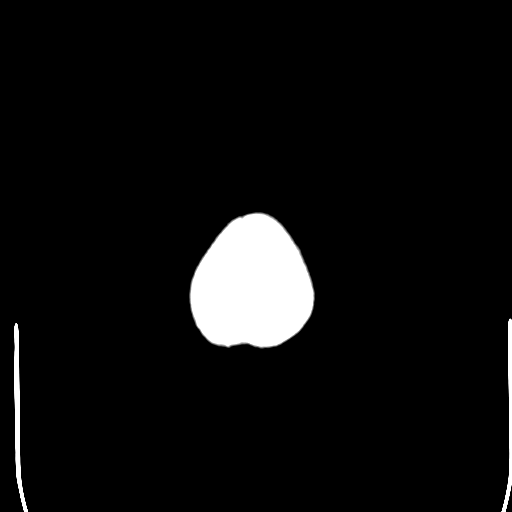

[Series 4: coronal soft · coronal · 0.33mm/px · 3 of 68 slices shown]
[im 23/68  brain]
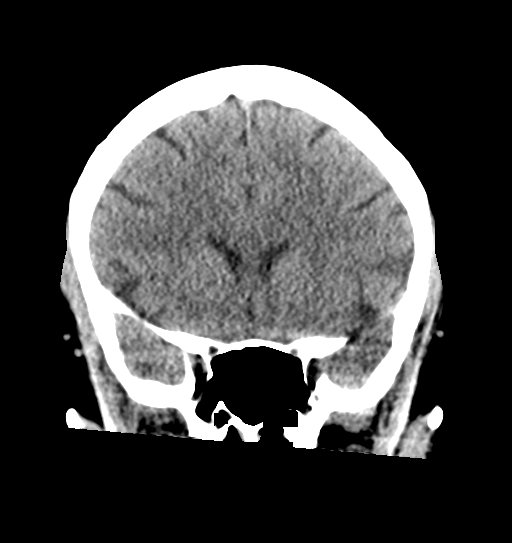
[im 30/68  brain]
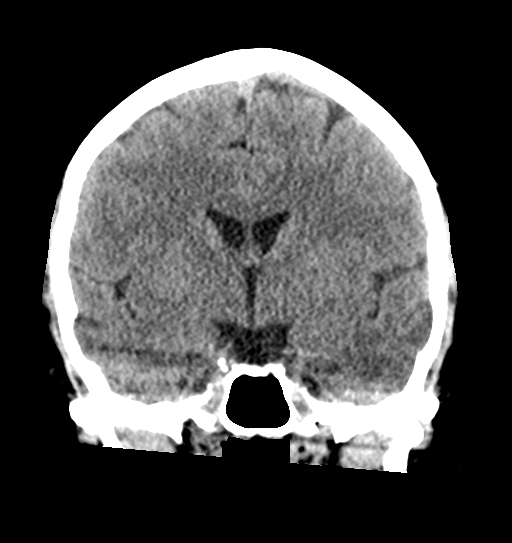
[im 38/68  brain]
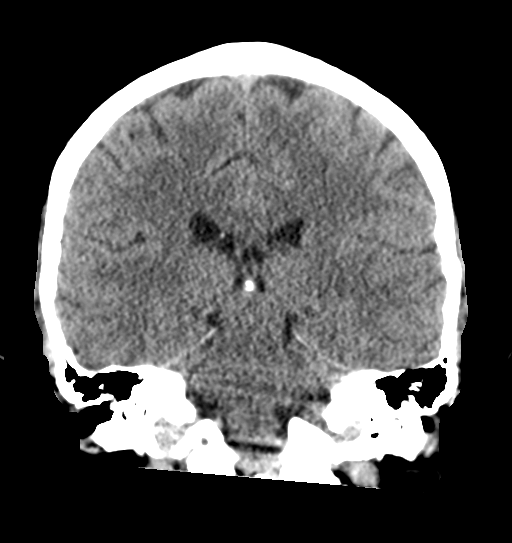

[Series 5: sagittal soft · sagittal · 0.34mm/px · 3 of 56 slices shown]
[im 19/56  brain]
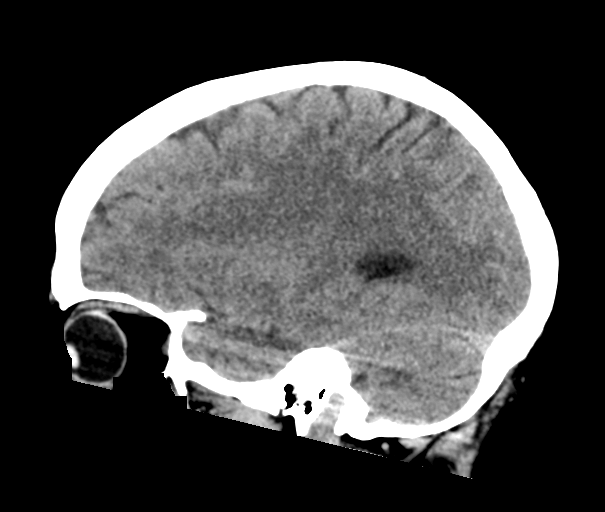
[im 28/56  brain]
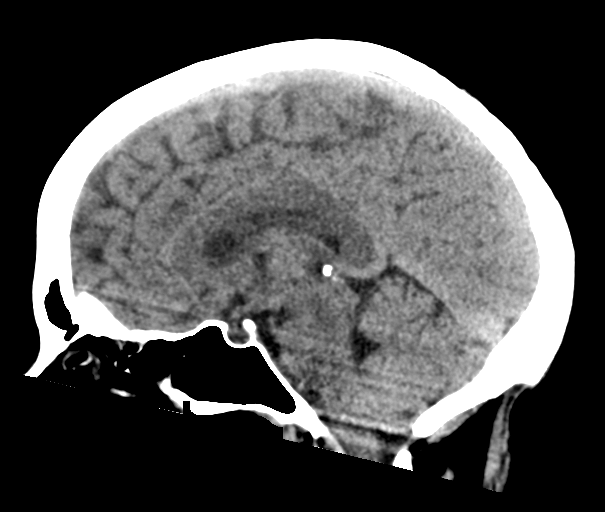
[im 37/56  brain]
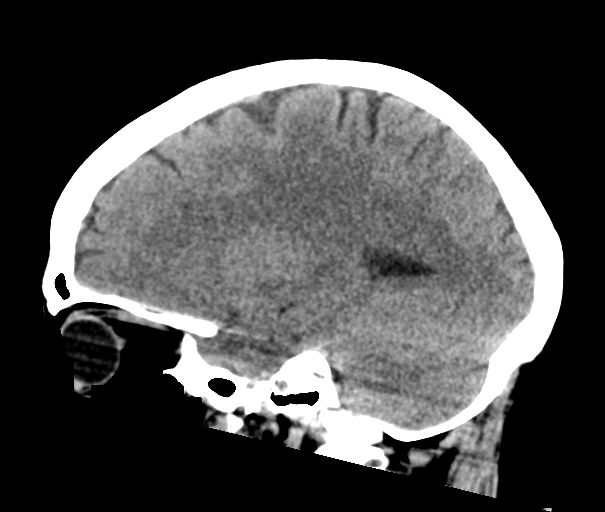

[16 of 47 positions shown; findings below may reference images not displayed]

FINDINGS: Brain: No evidence of acute infarction, hemorrhage, hydrocephalus,
extra-axial collection or mass lesion/mass effect.

Vascular: Negative for hyperdense vessel

Skull: Negative

Sinuses/Orbits: Negative

Other: None
IMPRESSION: Negative CT head

## 2020-12-28 ENCOUNTER — Ambulatory Visit: Payer: BC Managed Care – PPO | Admitting: Dermatology

## 2021-06-23 ENCOUNTER — Telehealth: Payer: Self-pay | Admitting: Family Medicine

## 2021-06-23 NOTE — Telephone Encounter (Signed)
Pt is asking for exception as new patient, please review and advise.  ? ?

## 2021-06-23 NOTE — Telephone Encounter (Signed)
Krista Bowen pt of [facility]iddle called in asking if she would be willing to take her mother in law on as new pt, please advise and call M[facility] to set up an appt.  ? ? ?

## 2021-07-14 NOTE — Telephone Encounter (Signed)
LM making pt aware of this asked her to call back to schedule an appt  ?

## 2021-07-14 NOTE — Telephone Encounter (Signed)
Can you accept this pt?  ?

## 2021-07-14 NOTE — Telephone Encounter (Signed)
Ok to establish 

## 2021-08-06 ENCOUNTER — Ambulatory Visit: Payer: Self-pay | Admitting: Family Medicine

## 2023-02-08 ENCOUNTER — Emergency Department (HOSPITAL_COMMUNITY): Payer: BC Managed Care – PPO

## 2023-02-08 ENCOUNTER — Emergency Department (HOSPITAL_COMMUNITY)
Admission: EM | Admit: 2023-02-08 | Discharge: 2023-02-08 | Disposition: A | Discharge status: Home / Self Care | Payer: BC Managed Care – PPO | Attending: Emergency Medicine | Admitting: Emergency Medicine

## 2023-02-08 ENCOUNTER — Other Ambulatory Visit: Payer: Self-pay

## 2023-02-08 ENCOUNTER — Encounter (HOSPITAL_COMMUNITY): Payer: Self-pay | Admitting: *Deleted

## 2023-02-08 DIAGNOSIS — H53149 Visual discomfort, unspecified: Secondary | ICD-10-CM | POA: Diagnosis not present

## 2023-02-08 DIAGNOSIS — Z79899 Other long term (current) drug therapy: Secondary | ICD-10-CM | POA: Diagnosis not present

## 2023-02-08 DIAGNOSIS — H9203 Otalgia, bilateral: Secondary | ICD-10-CM | POA: Diagnosis not present

## 2023-02-08 DIAGNOSIS — R110 Nausea: Secondary | ICD-10-CM | POA: Insufficient documentation

## 2023-02-08 DIAGNOSIS — I10 Essential (primary) hypertension: Secondary | ICD-10-CM | POA: Diagnosis not present

## 2023-02-08 DIAGNOSIS — E119 Type 2 diabetes mellitus without complications: Secondary | ICD-10-CM | POA: Diagnosis not present

## 2023-02-08 DIAGNOSIS — R519 Headache, unspecified: Secondary | ICD-10-CM | POA: Diagnosis present

## 2023-02-08 DIAGNOSIS — M542 Cervicalgia: Secondary | ICD-10-CM | POA: Insufficient documentation

## 2023-02-08 HISTORY — DX: Unspecified asthma, uncomplicated: J45.909

## 2023-02-08 HISTORY — DX: Type 2 diabetes mellitus without complications: E11.9

## 2023-02-08 LAB — COMPREHENSIVE METABOLIC PANEL
ALT: 21 U/L (ref 0–44)
AST: 15 U/L (ref 15–41)
Albumin: 4.4 g/dL (ref 3.5–5.0)
Alkaline Phosphatase: 71 U/L (ref 38–126)
Anion gap: 11 (ref 5–15)
BUN: 25 mg/dL — ABNORMAL HIGH (ref 6–20)
CO2: 23 mmol/L (ref 22–32)
Calcium: 9.5 mg/dL (ref 8.9–10.3)
Chloride: 101 mmol/L (ref 98–111)
Creatinine, Ser: 0.89 mg/dL (ref 0.44–1.00)
GFR, Estimated: >60 mL/min (ref >60)
Glucose, Bld: 208 mg/dL — ABNORMAL HIGH (ref 70–99)
Potassium: 4 mmol/L (ref 3.5–5.1)
Sodium: 135 mmol/L (ref 135–145)
Total Bilirubin: 0.7 mg/dL (ref <1.2)
Total Protein: 7.2 g/dL (ref 6.5–8.1)

## 2023-02-08 LAB — CBC WITH DIFFERENTIAL/PLATELET
Abs Immature Granulocytes: 0.02 10*3/uL (ref 0.00–0.07)
Basophils Absolute: 0.1 10*3/uL (ref 0.0–0.1)
Basophils Relative: 1 %
Eosinophils Absolute: 0.1 10*3/uL (ref 0.0–0.5)
Eosinophils Relative: 2 %
HCT: 42.2 % (ref 36.0–46.0)
Hemoglobin: 14.4 g/dL (ref 12.0–15.0)
Immature Granulocytes: 0 %
Lymphocytes Relative: 36 %
Lymphs Abs: 2.7 10*3/uL (ref 0.7–4.0)
MCH: 30.1 pg (ref 26.0–34.0)
MCHC: 34.1 g/dL (ref 30.0–36.0)
MCV: 88.1 fL (ref 80.0–100.0)
Monocytes Absolute: 0.4 10*3/uL (ref 0.1–1.0)
Monocytes Relative: 5 %
Neutro Abs: 4.2 10*3/uL (ref 1.7–7.7)
Neutrophils Relative %: 56 %
Platelets: 210 10*3/uL (ref 150–400)
RBC: 4.79 MIL/uL (ref 3.87–5.11)
RDW: 12.3 % (ref 11.5–15.5)
WBC: 7.4 10*3/uL (ref 4.0–10.5)
nRBC: 0 % (ref 0.0–0.2)

## 2023-02-08 MED ORDER — MORPHINE SULFATE (PF) 4 MG/ML IV SOLN
4.0000 mg | Freq: Once | INTRAVENOUS | Status: AC
  Administered 2023-02-08: 4 mg via INTRAVENOUS
  Filled 2023-02-08: qty 1

## 2023-02-08 MED ORDER — SODIUM CHLORIDE 0.9 % IV BOLUS
1000.0000 mL | Freq: Once | INTRAVENOUS | Status: AC
  Administered 2023-02-08: 1000 mL via INTRAVENOUS

## 2023-02-08 MED ORDER — MAGNESIUM SULFATE 2 GM/50ML IV SOLN
2.0000 g | Freq: Once | INTRAVENOUS | Status: AC
  Administered 2023-02-08: 2 g via INTRAVENOUS
  Filled 2023-02-08: qty 50

## 2023-02-08 MED ORDER — METOCLOPRAMIDE HCL 5 MG/ML IJ SOLN
10.0000 mg | Freq: Once | INTRAMUSCULAR | Status: DC
  Filled 2023-02-08: qty 2

## 2023-02-08 MED ORDER — IOHEXOL 300 MG/ML  SOLN
75.0000 mL | Freq: Once | INTRAMUSCULAR | Status: AC | PRN
  Administered 2023-02-08: 75 mL via INTRAVENOUS

## 2023-02-08 MED ORDER — DIPHENHYDRAMINE HCL 50 MG/ML IJ SOLN
25.0000 mg | Freq: Once | INTRAMUSCULAR | Status: AC
  Administered 2023-02-08: 25 mg via INTRAVENOUS
  Filled 2023-02-08: qty 1

## 2023-02-08 MED ORDER — IOHEXOL 350 MG/ML SOLN
75.0000 mL | Freq: Once | INTRAVENOUS | Status: AC | PRN
  Administered 2023-02-08: 75 mL via INTRAVENOUS

## 2023-02-08 NOTE — ED Triage Notes (Signed)
Pt states she has had a headache since October 26th and she has been following her PCP for a migraine; pt has been given medication for migraine but she says it has not been helping  Pt states she has been having sharp pains running up the back of her neck on the right side  Pt states she has been having pain to her left ear and pain under her left eye

## 2023-02-08 NOTE — ED Provider Notes (Signed)
[place] EMERGENCY DEPARTMENT AT [facility] Provider Note   CSN: [id number] Arrival date & time: 02/08/23  1445     History  No chief complaint on file.   Krista Bowen is a 57 y.o. female, history of hypertension, diabetes, who presents to the ED secondary to severe headache, this been going on for the last 2 months.  She states it started October 26, and has only went away for a few hours and immediately came back.  She states that it started on her left side of her head, with associated nausea, photophobia and phonophobia.  She went to the ER and she was given a headache cocktail which made it go away for a few hours, but then it came back.  Went to her primary care doctor, Si Gaul was given oral migraine medicine, as well as a referral to neurologist.  She states that she cannot tolerate the pain anymore, and this time is on the right side of her neck, and then her head, and as she has some eye pain, as well as ear pain.  Notes that she is not have any kind of changes in vision, difficulty speech, or weakness in one-sided the body.  No history of migraines.  Home Medications Prior to Admission medications   Medication Sig Start Date End Date Taking? Authorizing Provider  ALPRAZolam (XANAX) 0.25 MG tablet Take 0.25 mg by mouth at bedtime. 01/11/23  Yes [provider]  aspirin-acetaminophen-caffeine (EXCEDRIN MIGRAINE) [phone] MG tablet Take 1 tablet by mouth every 6 (six) hours as needed for headache.   Yes [provider]  diphenhydrAMINE (BENADRYL) 25 mg capsule Take 25 mg by mouth every 6 (six) hours as needed for sleep.   Yes [provider]  guaiFENesin (MUCINEX) 600 MG 12 hr tablet Take 600 mg by mouth 2 (two) times daily.   Yes [provider]  ibuprofen (ADVIL) 100 MG tablet Take 100 mg by mouth every 6 (six) hours as needed for fever.   Yes [provider]  losartan-hydrochlorothiazide (HYZAAR) 100-25 MG per tablet Take 0.5  tablets by mouth daily.   Yes [provider]  MOUNJARO 5 MG/0.5ML Pen Inject 5 mg into the skin once a week.   Yes [provider]  potassium chloride SA (KLOR-CON) 20 MEQ tablet Take 20 mEq by mouth daily. 03/15/19  Yes [provider]  sertraline (ZOLOFT) 100 MG tablet Take 100 mg by mouth daily.     Yes [provider]      Allergies    Amoxicillin-pot clavulanate    Review of Systems   Review of Systems  Neurological:  Positive for headaches. Negative for speech difficulty.    Physical Exam Updated Vital Signs BP 127/65   Pulse 71   Temp 98 F (36.7 C) (Oral)   Resp 14   Ht 5\' 4"  (1.626 m)   Wt 68 kg   LMP 03/02/2016 (LMP Unknown)   SpO2 98%   BMI 25.75 kg/m  Physical Exam Vitals and nursing note reviewed.  Constitutional:      General: She is not in acute distress.    Appearance: She is well-developed.  HENT:     Head: Normocephalic and atraumatic.     Comments: Tenderness to palpation of sinuses    Right Ear: Tympanic membrane normal.     Left Ear: Tympanic membrane normal.     Nose: Nose normal.     Mouth/Throat:     Mouth: Mucous membranes are moist.  Eyes:     Conjunctiva/sclera: Conjunctivae normal.  Cardiovascular:     Rate and Rhythm: Normal rate and regular rhythm.     Heart sounds: No murmur heard. Pulmonary:     Effort: Pulmonary effort is normal. No respiratory distress.     Breath sounds: Normal breath sounds.  Abdominal:     Palpations: Abdomen is soft.     Tenderness: There is no abdominal tenderness.  Musculoskeletal:        General: No swelling.     Cervical back: Neck supple.  Skin:    General: Skin is warm and dry.     Capillary Refill: Capillary refill takes less than 2 seconds.  Neurological:     General: No focal deficit present.     Mental Status: She is alert and oriented to person, place, and time.     Cranial Nerves: No cranial nerve deficit.     Sensory: No sensory deficit.     Motor: No  weakness.  Psychiatric:        Mood and Affect: Mood normal.     ED Results / Procedures / Treatments   Labs (all labs ordered are listed, but only abnormal results are displayed) Labs Reviewed  COMPREHENSIVE METABOLIC PANEL - Abnormal; Notable for the following components:      Result Value   Glucose, Bld 208 (*)    BUN 25 (*)    All other components within normal limits  CBC WITH DIFFERENTIAL/PLATELET    EKG None  Radiology CT VENOGRAM HEAD  Result Date: 02/08/2023 CLINICAL DATA:  New onset headache EXAM: CT VENOGRAM HEAD TECHNIQUE: Venographic phase images of the brain were obtained following the administration of intravenous contrast. Multiplanar reformats and maximum intensity projections were generated. RADIATION DOSE REDUCTION: This exam was performed according to the departmental dose-optimization program which includes automated exposure control, adjustment of the mA and/or kV according to patient size and/or use of iterative reconstruction technique. CONTRAST:  75mL OMNIPAQUE IOHEXOL 300 MG/ML  SOLN COMPARISON:  None Available. FINDINGS: Superior sagittal sinus: Normal. Straight sinus: Normal. Inferior sagittal sinus, vein of Galen and internal cerebral veins: Normal. Transverse sinuses: Normal. Sigmoid sinuses: Normal. Visualized jugular veins: Normal. IMPRESSION: Normal CT venogram of the head. Electronically Signed   By: Deatra Robinson M.D.   On: 02/08/2023 20:33   CT ANGIO HEAD NECK W WO CM  Result Date: 02/08/2023 CLINICAL DATA:  Severe headache over the last 2 months. Unknown etiology. EXAM: CT ANGIOGRAPHY HEAD AND NECK WITH AND WITHOUT CONTRAST TECHNIQUE: Multidetector CT imaging of the head and neck was performed using the standard protocol during bolus administration of intravenous contrast. Multiplanar CT image reconstructions and MIPs were obtained to evaluate the vascular anatomy. Carotid stenosis measurements (when applicable) are obtained utilizing NASCET criteria,  using the distal internal carotid diameter as the denominator. RADIATION DOSE REDUCTION: This exam was performed according to the departmental dose-optimization program which includes automated exposure control, adjustment of the mA and/or kV according to patient size and/or use of iterative reconstruction technique. CONTRAST:  75mL OMNIPAQUE IOHEXOL 350 MG/ML SOLN COMPARISON:  Head CT 03/31/2019 FINDINGS: CT HEAD FINDINGS Brain: The brain shows a normal appearance without evidence of malformation, atrophy, old or acute [place] Moring or large vessel infarction, mass lesion, hemorrhage, hydrocephalus or extra-axial collection. Vascular: No hyperdense vessel. No evidence of atherosclerotic calcification. Skull: Normal.  No traumatic finding.  No focal bone lesion. Sinuses/Orbits: Sinuses are clear. Orbits appear normal. Mastoids are clear. Other: None significant CTA NECK FINDINGS  Aortic arch: Aortic atherosclerosis. Branching pattern is normal without origin stenosis. Right carotid system: Common carotid artery widely patent to the bifurcation. Carotid bifurcation is normal without soft or calcified plaque. No stenosis. Cervical ICA is mildly tortuous but widely patent. Left carotid system: Left carotid system similarly normal other than mild tortuosity. Vertebral arteries: Both vertebral artery origins are widely patent. Both vessels appear normal through the cervical region to the foramen magnum. Skeleton: Ordinary mid cervical spondylosis. No evidence of craniocervical junction degenerative change. Other neck: No mass or lymphadenopathy. Upper chest: Lung apices are clear. Review of the MIP images confirms the above findings CTA HEAD FINDINGS Anterior circulation: Both internal carotid arteries are patent through the skull base and siphon regions. There is mild siphon atherosclerotic calcification but no stenosis. The anterior and middle cerebral vessels are patent. No occlusion or stenosis. No aneurysm or vascular  malformation. Posterior circulation: Both vertebral arteries widely patent to the basilar artery. No basilar stenosis. Posterior circulation branch vessels are normal. Venous sinuses: Patent and normal. Anatomic variants: None significant. Review of the MIP images confirms the above findings IMPRESSION: 1. Normal head CT. 2. Aortic atherosclerosis. 3. No carotid bifurcation disease. Mild tortuosity of the cervical ICAs bilaterally, which can be seen in chronic hypertension. 4. No evidence of aneurysm or other cause of headache. Aortic Atherosclerosis (ICD10-I70.0). Electronically Signed   By: Paulina Fusi M.D.   On: 02/08/2023 18:43    Procedures Procedures    Medications Ordered in ED Medications  metoCLOPramide (REGLAN) injection 10 mg (0 mg Intravenous Hold 02/08/23 1651)  sodium chloride 0.9 % bolus 1,000 mL (1,000 mLs Intravenous Bolus 02/08/23 1636)  diphenhydrAMINE (BENADRYL) injection 25 mg (25 mg Intravenous Given 02/08/23 1637)  magnesium sulfate IVPB 2 g 50 mL (0 g Intravenous Stopped 02/08/23 1823)  iohexol (OMNIPAQUE) 350 MG/ML injection 75 mL (75 mLs Intravenous Contrast Given 02/08/23 1803)  morphine (PF) 4 MG/ML injection 4 mg (4 mg Intravenous Given 02/08/23 2018)  iohexol (OMNIPAQUE) 300 MG/ML solution 75 mL (75 mLs Intravenous Contrast Given 02/08/23 1927)    ED Course/ Medical Decision Making/ A&P                                 Medical Decision Making Patient is a 57 year old female, here for severe headache that is been going on since October 26.  She has associated phonophobia photophobia and nausea.  This continually comes back.  States she is post to go to her neurologist, who cannot tolerate the pain anymore.  Also reports some ear pain.  Ears look good, there is no evidence of kind of any kind of infection.  She does have a little bit of tenderness to palpation of the sinuses however she is not presenting like a sinus infection.  No nasal congestion, or fevers.  We will  obtain a CTA head/neck and CT venogram, for further evaluation.  Will try migraine cocktail and see if there is any relief.  She has no focal neurodeficits on my exam.  Which is reassuring.  Amount and/or Complexity of Data Reviewed Labs: ordered.    Details: Unremarkable Radiology: ordered.    Details: CTA head/neck, and CTV head unremarkable Discussion of management or test interpretation with external provider(s): Discussed with patient, after multiple rounds of medications, she is finally feeling little bit better, magnesium, and fluids did not help, but morphine did.  Her imaging is reassuring, there is no evidence  of a thrombus, or aneurysm present.  I advised her she will need to follow-up with neurology for further evaluation, and she voiced understanding.  She may be having some pressure in her ears, as secondary to changes in the atmosphere, from the weather.  I recommended she start Zyrtec and Flonase for this.  Risk Prescription drug management.   Final Clinical Impression(s) / ED Diagnoses Final diagnoses:  Acute nonintractable headache, unspecified headache type  Otalgia of both ears    Rx / DC Orders ED Discharge Orders     None         [place]ha Doster, Harley [place], PA 02/08/23 2142    Linwood Dibbles, MD 02/09/23 1002

## 2023-02-08 NOTE — ED Notes (Signed)
PA aware of headache still and requests nonrebreather at 15 lpm for cluster migraine tx.

## 2023-02-08 NOTE — Discharge Instructions (Signed)
Please follow-up with neurology as instructed, the cause of your headache is unclear.  Your CAT scans were reassuring however.  Please return to the ER if you have any weakness on one side of the body, slurring of the speech, or numbness or tingling.  Additionally the cause of your earache is unclear, and may be secondary to pressure, from the seizure that seasons.  I recommend that you take some Zyrtec, and Flonase, to help with your symptoms.  Return to the ER if you feel like your symptoms are worsening

## 2023-02-20 ENCOUNTER — Encounter: Payer: Self-pay | Admitting: Neurology

## 2023-06-16 NOTE — Progress Notes (Deleted)
 NEUROLOGY CONSULTATION NOTE  Krista Bowen MRN: [id number] DOB: [birth year]  Referring provider: Gilman Schmidt, NP Primary care provider: Gilman Schmidt, NP  Reason for consult:  headache  Assessment/Plan:   ***   Subjective:  Krista Bowen is a 58 year old ***-handed female with HTN, DM 2, asthma and depression who presents for headaches.  History supplemented by ED and referring provider's notes.  CTA and CTV personally reviewed.  Onset:  12/11/2022 Location:  left retro-orbital Quality:  burning  Intensity:  ***.   Aura:  *** Prodrome:  *** Associated symptoms:  photophobia, allodynia of eye.  She denies associated unilateral numbness or weakness. Duration:  *** Frequency:  *** Frequency of abortive medication: *** Triggers:  *** Relieving factors:  *** Activity:  ***  Seen in the ED twice on 01/11/2023 and 02/08/2023.  CT/CTV head and CTA head and neck from 02/08/2023 showed mild tortuosity of the cervical ICAs bilaterally but otherwise no acute intracranial abnormality, venous thrombosis, aneurysm or other vascular malformation.    Past NSAIDS/analgesics:  Excedrin Mgraine Past abortive triptans:  *** Past abortive ergotamine:  *** Past muscle relaxants:  *** Past anti-emetic:  *** Past antihypertensive medications:  *** Past antidepressant medications:  *** Past anticonvulsant medications:  *** Past anti-CGRP:  *** Past vitamins/Herbal/Supplements:  *** Past antihistamines/decongestants:  *** Other past therapies:  ***  Current NSAIDS/analgesics:  *** Current triptans:  none Current ergotamine:  none Current anti-emetic:  none Current muscle relaxants:  none Current Antihypertensive medications:  losartan, HCTZ Current Antidepressant medications:  sertraline 100mg  BID Current Anticonvulsant medications:  none Current anti-CGRP:  none Other therapy:  ***   Caffeine:  *** Alcohol:  *** Smoker:  *** Diet:  *** Exercise:  *** Depression:  ***;  Anxiety:  *** Other pain:  *** Sleep hygiene:  *** Family history of headache:  ***      PAST MEDICAL HISTORY: Past Medical History:  Diagnosis Date   Asthma    Depression    Diabetes mellitus without complication (HCC)    Hypertension     PAST SURGICAL HISTORY: Past Surgical History:  Procedure Laterality Date   APPENDECTOMY     CHOLECYSTECTOMY     TUBAL LIGATION      MEDICATIONS: Current Outpatient Medications on File Prior to Visit  Medication Sig Dispense Refill   ALPRAZolam (XANAX) 0.25 MG tablet Take 0.25 mg by mouth at bedtime.     aspirin-acetaminophen-caffeine (EXCEDRIN MIGRAINE) 250-250-65 MG tablet Take 1 tablet by mouth every 6 (six) hours as needed for headache.     diphenhydrAMINE (BENADRYL) 25 mg capsule Take 25 mg by mouth every 6 (six) hours as needed for sleep.     guaiFENesin (MUCINEX) 600 MG 12 hr tablet Take 600 mg by mouth 2 (two) times daily.     ibuprofen (ADVIL) 100 MG tablet Take 100 mg by mouth every 6 (six) hours as needed for fever.     losartan-hydrochlorothiazide (HYZAAR) 100-25 MG per tablet Take 0.5 tablets by mouth daily.     MOUNJARO 5 MG/0.5ML Pen Inject 5 mg into the skin once a week.     potassium chloride SA (KLOR-CON) 20 MEQ tablet Take 20 mEq by mouth daily.     sertraline (ZOLOFT) 100 MG tablet Take 100 mg by mouth daily.       No current facility-administered medications on file prior to visit.    ALLERGIES: Allergies  Allergen Reactions   Amoxicillin-Pot Clavulanate Hives    FAMILY HISTORY: Family History  Problem Relation Age of Onset   Diabetes Mother    Hypertension Mother    Stroke Mother    Hypertension Father    Stroke Father    Heart failure Father     Objective:  *** General: No acute distress.  Patient appears well-groomed.   Head:  Normocephalic/atraumatic Eyes:  fundi examined but not visualized Neck: supple, no paraspinal tenderness, full range of motion Back: No paraspinal tenderness Heart:  regular rate and rhythm Lungs: Clear to auscultation bilaterally. Vascular: No carotid bruits. Neurological Exam: Mental status: alert and oriented to person, place, and time, speech fluent and not dysarthric, language intact. Cranial nerves: CN I: not tested CN II: pupils equal, round and reactive to light, visual fields intact CN III, IV, VI:  full range of motion, no nystagmus, no ptosis CN V: facial sensation intact. CN VII: upper and lower face symmetric CN VIII: hearing intact CN IX, X: gag intact, uvula midline CN XI: sternocleidomastoid and trapezius muscles intact CN XII: tongue midline Bulk & Tone: normal, no fasciculations. Motor:  muscle strength 5/5 throughout Sensation:  Pinprick, temperature and vibratory sensation intact. Deep Tendon Reflexes:  2+ throughout,  toes downgoing.   Finger to nose testing:  Without dysmetria.   Heel to shin:  Without dysmetria.   Gait:  Normal station and stride.  Romberg negative.    Thank you for allowing me to take part in the care of this patient.  Shon Millet, DO  CC: ***

## 2023-06-19 ENCOUNTER — Ambulatory Visit: Payer: BC Managed Care – PPO | Admitting: Neurology

## 2023-07-13 NOTE — Progress Notes (Signed)
 NEUROLOGY CONSULTATION NOTE  Krista Bowen MRN: [id number] DOB: 1965/10/06  Referring provider: Mindi Alto, NP Primary care provider: Mindi Alto, NP  Reason for consult:  headache  Assessment/Plan:   Cluster headache, intractable  Will check MRI of brain with and without contrast Headache prevention:  as she continues to have headaches, will start verapamil 80mg  three times daily.  Will request PCP to check EKG for medication management and have results sent to me. Headache rescue:  sumatriptan 50mg .  Consider home O2. Discontinue OTC analgesics. Limit use of pain relievers to no more than 9 days out of the month to prevent risk of rebound or medication-overuse headache. Keep headache diary Follow up 6 months.   Subjective:  Krista Bowen is a 58 year old right-handed female with HTN, DM 2, asthma and depression who presents for headaches.  History supplemented by her accompanying daughter and ED and referring provider's notes.  CTA and CTV personally reviewed.  In October, she developed paroxysmal stabbing pain in her left parieto-occipital region that progressed to a severe 9-10/10 left retro-orbital pressure and pain in the left ear, nose and periorbital and maxillary region.  She had associated scalp and face tenderness.  Associated with nausea, photophobia, and left eye lacrimation but no vomiting, visual disturbance, ptosis, conjunctival injection or rhinorrhea.  No specific trigger or aggravating factors.  She could not get comfortable and resting in bed did not provide any relief.  Headache was persistent but more severe around 2-3 PM.  Unable to work.  She went to the ED on 01/11/2023 where she received a headache cocktail.  Headaches did not improve, so she returned on 02/08/2023.  CT/CTV head and CTA head and neck from 02/08/2023 showed mild tortuosity of the cervical ICAs bilaterally but otherwise no acute intracranial abnormality, venous thrombosis, aneurysm or  other vascular malformation.  She was treated with O2 which did provide relief.  She was subsequently prescribed antibiotics and steroid taper for potential sinusitis and the intractable headache finally broke around Christmas.    She did well up until March 2025, when she started having headaches again.  It is much less severe, maybe 6/10 and mostly an aching in the left posterior hemisphere with allodynia, nausea and photophobia, lasting 4 to 5 days and occurring once or twice a month.  Treats with alternating Excedrin, ibuprofen  and Tylenol .  She has had similar headache in the past but they would only occur about 3 times a year.    Past NSAIDS/analgesics:  none Past abortive triptans:  none Past abortive ergotamine:  none Past muscle relaxants:  none Past anti-emetic:  none Past antihypertensive medications:  none Past antidepressant medications:  Wellbutrin Past anticonvulsant medications:  none Past anti-CGRP:  Nurtec PRN Past vitamins/Herbal/Supplements:  none Past antihistamines/decongestants:  none Other past therapies:  none  Current NSAIDS/analgesics:  Excedrin, ibuprofen , Tylenol  Current triptans:  none Current ergotamine:  none Current anti-emetic:  none Current muscle relaxants:  none Current Antihypertensive medications:  losartan, HCTZ Current Antidepressant medications:  sertraline 100mg  BID Current Anticonvulsant medications:  none Current anti-CGRP:  none Current antihistamine:  Benadryl  Other therapy:  none   Caffeine :  2 cups coffee daily.  Diet [place] Alcohol:  none Smoker:  yes.  Not a trigger Sleep hygiene:  interrupted.    She works 12 hour night shifts on an [employer] Family history of headache:  father      PAST MEDICAL HISTORY: Past Medical History:  Diagnosis Date   Asthma  Depression    Diabetes mellitus without complication (HCC)    Hypertension     PAST SURGICAL HISTORY: Past Surgical History:  Procedure Laterality Date    APPENDECTOMY     CHOLECYSTECTOMY     TUBAL LIGATION      MEDICATIONS: Current Outpatient Medications on File Prior to Visit  Medication Sig Dispense Refill   ALPRAZolam (XANAX) 0.25 MG tablet Take 0.25 mg by mouth at bedtime.     aspirin-acetaminophen -caffeine  (EXCEDRIN MIGRAINE) 250-250-65 MG tablet Take 1 tablet by mouth every 6 (six) hours as needed for headache.     diphenhydrAMINE  (BENADRYL ) 25 mg capsule Take 25 mg by mouth every 6 (six) hours as needed for sleep.     guaiFENesin  (MUCINEX ) 600 MG 12 hr tablet Take 600 mg by mouth 2 (two) times daily.     ibuprofen  (ADVIL ) 100 MG tablet Take 100 mg by mouth every 6 (six) hours as needed for fever.     losartan-hydrochlorothiazide (HYZAAR) 100-25 MG per tablet Take 0.5 tablets by mouth daily.     MOUNJARO 5 MG/0.5ML Pen Inject 5 mg into the skin once a week.     potassium chloride SA (KLOR-CON) 20 MEQ tablet Take 20 mEq by mouth daily.     sertraline (ZOLOFT) 100 MG tablet Take 100 mg by mouth daily.       No current facility-administered medications on file prior to visit.    ALLERGIES: Allergies  Allergen Reactions   Amoxicillin-Pot Clavulanate Hives    FAMILY HISTORY: Family History  Problem Relation Age of Onset   Diabetes Mother    Hypertension Mother    Stroke Mother    Hypertension Father    Stroke Father    Heart failure Father     Objective:  Blood pressure 111/71, pulse 74, height 5\' 4"  (1.626 m), weight 153 lb (69.4 kg), SpO2 97%. General: No acute distress.  Patient appears well-groomed.   Head:  Normocephalic/atraumatic Eyes:  fundi examined but not visualized Neck: supple, no paraspinal tenderness, full range of motion Heart: regular rate and rhythm Neurological Exam: Mental status: alert and oriented to person, place, and time, speech fluent and not dysarthric, language intact. Cranial nerves: CN I: not tested CN II: pupils equal, round and reactive to light, visual fields intact CN III, IV, VI:   full range of motion, no nystagmus, no ptosis CN V: facial sensation intact. CN VII: upper and lower face symmetric CN VIII: hearing intact CN IX, X: gag intact, uvula midline CN XI: sternocleidomastoid and trapezius muscles intact CN XII: tongue midline Bulk & Tone: normal, no fasciculations. Motor:  muscle strength 5/5 throughout Sensation:  Pinprick, temperature and vibratory sensation intact. Deep Tendon Reflexes:  2+ throughout,  toes downgoing.   Finger to nose testing:  Without dysmetria.   Heel to shin:  Without dysmetria.   Gait:  Normal station and stride.  Romberg negative.    Thank you for allowing me to take part in the care of this patient.  Janne Members, DO  CC: Mindi Alto, NP

## 2023-07-14 ENCOUNTER — Encounter: Payer: Self-pay | Admitting: Neurology

## 2023-07-14 ENCOUNTER — Ambulatory Visit: Admitting: Neurology

## 2023-07-14 VITALS — BP 111/71 | HR 74 | Ht 64.0 in | Wt 153.0 lb

## 2023-07-14 DIAGNOSIS — G44011 Episodic cluster headache, intractable: Secondary | ICD-10-CM

## 2023-07-14 DIAGNOSIS — Z79899 Other long term (current) drug therapy: Secondary | ICD-10-CM | POA: Diagnosis not present

## 2023-07-14 MED ORDER — VERAPAMIL HCL 80 MG PO TABS: 80.0000 mg | ORAL_TABLET | Freq: Three times a day (TID) | ORAL | 5 refills | Status: DC

## 2023-07-14 MED ORDER — SUMATRIPTAN SUCCINATE 50 MG PO TABS: 50.0000 mg | ORAL_TABLET | ORAL | 5 refills | Status: DC | PRN

## 2023-07-14 NOTE — Patient Instructions (Signed)
 MRI of brain with and without contrast Start verapamil 80mg  three times daily.  Check baseline EKG at PCP's office At earliest onset of headache, take sumatriptan.  May repeat after 2 hours if needed.  Maximum 2 tablets in 24 hours. Stop ibuprofen , Tylenol  and Excedrin.   .Limit use of pain relievers to no more than 9 days out of the month to prevent risk of rebound or medication-overuse headache. Keep headache diary Follow up 6 months.   Cluster Headache Cluster headaches hurt a lot. They normally happen on one side of your head or face, but they may switch sides. Often, cluster headaches: Cause a lot of pain. Happen often for weeks to months. Last from 15 minutes to 3 hours. Happen at the same time each day, often at night. Happen many times a day. Happen in the fall and springtime. What are the causes? The exact cause is not known. They are not usually caused by foods, changes in body chemicals (hormonal changes), or stress. What increases the risk? Being a female between the ages of 46-33 years old. Smoking or using products that contain nicotine or tobacco. Having high levels of body chemical called histamine. This can happen in people who have allergies. Taking certain medicines that cause blood vessels to get bigger. Having a parent or sibling who has cluster headaches. What are the signs or symptoms? Very bad pain on one side of the head that begins behind or around your eye but may spread to your face, head, and neck. Feeling like you may vomit (nauseous). Being sensitive to light. A runny nose and stuffy nose. Sweating on the forehead or face on the side where the pain is. Eye problems. This might include a droopy or swollen eyelid, eye redness, or tearing on the side where the pain is. Feeling restless or upset. Pale skin or a red face (flushed). How is this treated? Medicines. Oxygen that is breathed in through a mask. A steroid shot (injection) in the back of the head  just above the neck (occipital nerve block). This shot numbs painful areas in the head. Surgery in very bad cases. Follow these instructions at home: Headache diary Keep a headache diary. Doing this can help you and your doctor find out what triggers your headaches. In the diary, include: The time of day that your headache started and what you were doing when it began. How long your headache lasted. Where your pain started and if it moved to other areas. The type of pain. Your level of pain. Use a pain scale and rate the pain with a number from 1 (mild) up to 10 (very bad). The treatment that you used, and any change in symptoms after treatment. Medicines Take over-the-counter and prescription medicines only as told by your doctor. If told, take steps to prevent problems with pooping (constipation). You may need to: Drink enough fluid to keep your pee (urine) pale yellow. Take medicines. You will be told what medicines to take. Eat foods that are high in fiber. These include beans, whole grains, and fresh fruits and vegetables. Limit foods that are high in fat and sugar. These include fried or sweet foods. Ask your doctor if you should avoid driving or using machines while you are taking your medicine. Lifestyle Get 7-9 hours of sleep each night, or the amount recommended by your doctor. Limit or manage stress. Options may include acupuncture, counseling, and massage. Exercise regularly. Exercise for at least 30 minutes, 5 times each week. Eat a  healthy diet. Avoid any foods that you know may trigger your headaches. Do not drink alcohol. Do not smoke or use any products that contain nicotine or tobacco. If you need help quitting, ask your doctor. General instructions Use oxygen as told by your doctor. Contact a doctor if: Your headaches: Change. Get worse. Happen more often. Your medicines or oxygen are not helping. Get help right away if: You faint. You have weakness or lose  feeling (have numbness) on one side of your body or face. You see two of everything (double vision). You vomit or feel you may vomit (nauseous), and it does not stop after many hours. You have trouble with your balance or with walking. You have trouble talking. You have neck pain or stiffness and you have a fever. This information is not intended to replace advice given to you by your health care provider. Make sure you discuss any questions you have with your health care provider. Document Revised: 11/11/2021 Document Reviewed: 10/18/2021 Elsevier Patient Education  2024 [employer].

## 2023-08-28 LAB — HEMOGLOBIN A1C: Hemoglobin A1C: 9.2

## 2023-08-28 LAB — BASIC METABOLIC PANEL WITH GFR
BUN: 15 (ref 4–21)
Creatinine: 0.8 (ref 0.5–1.1)
Glucose: 154

## 2023-08-28 LAB — LIPID PANEL
LDL Cholesterol: 213
Triglycerides: 236 — AB (ref 40–160)

## 2023-08-28 LAB — COMPREHENSIVE METABOLIC PANEL WITH GFR: eGFR: 89

## 2023-08-30 ENCOUNTER — Encounter: Payer: Self-pay | Admitting: Neurology

## 2023-09-01 ENCOUNTER — Ambulatory Visit
Admission: RE | Admit: 2023-09-01 | Discharge: 2023-09-01 | Disposition: A | Discharge status: Home / Self Care | Source: Ambulatory Visit | Attending: Neurology

## 2023-09-01 DIAGNOSIS — G44011 Episodic cluster headache, intractable: Secondary | ICD-10-CM

## 2023-09-01 MED ORDER — GADOPICLENOL 0.5 MMOL/ML IV SOLN
7.0000 mL | Freq: Once | INTRAVENOUS | Status: AC | PRN
  Administered 2023-09-01: 7 mL via INTRAVENOUS

## 2023-09-04 ENCOUNTER — Ambulatory Visit: Payer: Self-pay | Admitting: Neurology

## 2023-09-04 NOTE — Progress Notes (Signed)
 Patient advised of her MRI results.   Per patient the medication given only dulls the pain, she been out of work since 6/19

## 2023-09-05 ENCOUNTER — Encounter: Payer: Self-pay | Admitting: Neurology

## 2023-09-05 MED ORDER — VERAPAMIL HCL 80 MG PO TABS: 80.0000 mg | ORAL_TABLET | Freq: Three times a day (TID) | ORAL | 5 refills | Status: AC

## 2023-09-05 MED ORDER — SUMATRIPTAN SUCCINATE 100 MG PO TABS: 100.0000 mg | ORAL_TABLET | ORAL | 5 refills | Status: AC | PRN

## 2023-09-05 NOTE — Telephone Encounter (Signed)
 Then I would increase verapamil  to 80mg  in morning, 80mg  in afternoon and 160mg  at night (please send prescription in for 80mg  tablet (1 tablet in morning, 1 tablet in afternoon, and 2 tablets at night), qty 120, refills 5.  If no improvement in 2 weeks, she is to contact us .  Also I want to follow up on the EKG that was supposed to have been performed at her PCP's office. I would like to increase sumatriptan  to 100mg  tablet - take 1 tablet at earliest onset of headache.  May repeat after 2 hours.  Maximum 2 tablets in 24 hours.  Please send prescription if she agrees.

## 2023-09-06 ENCOUNTER — Telehealth: Payer: Self-pay | Admitting: Neurology

## 2023-09-06 NOTE — Telephone Encounter (Signed)
 Per patient daughter patient never started the Verapamil  80 mg TID. Only was taking the Sumatriptan  since she had to wait on the EKG.   Per Daughter the PCP stated he will keep her out of work, and send her to Diabetic specialist. Notes in patient chart stated he was filling out the paperwork.   Paperwork was supposed to update and sent out. Never done.

## 2023-09-06 NOTE — Telephone Encounter (Signed)
 Patient daughter called and has questions  about the daily medication.   The name Verapamil   and paperwork  please call

## 2023-09-07 NOTE — Telephone Encounter (Signed)
 Per Dr.Jaffe, Then start verapamil  80mg  three times daily. If no improvement in 2 weeks, contact us .   Daughter advised.

## 2023-09-26 ENCOUNTER — Encounter: Payer: Self-pay | Admitting: Neurology

## 2023-09-29 ENCOUNTER — Encounter: Payer: Self-pay | Admitting: Neurology

## 2023-10-09 ENCOUNTER — Telehealth: Payer: Self-pay

## 2023-10-09 NOTE — Telephone Encounter (Signed)
 Disability form received from [employer].

## 2023-10-12 ENCOUNTER — Encounter: Payer: Self-pay | Admitting: Neurology

## 2023-12-08 LAB — HEMOGLOBIN A1C: Hemoglobin A1C: 8.1

## 2023-12-08 LAB — BASIC METABOLIC PANEL WITH GFR
BUN: 17 (ref 4–21)
Creatinine: 0.9 (ref 0.5–1.1)
Glucose: 159

## 2023-12-08 LAB — COMPREHENSIVE METABOLIC PANEL WITH GFR: eGFR: 77

## 2024-01-12 NOTE — Patient Instructions (Signed)

## 2024-01-17 ENCOUNTER — Encounter: Payer: Self-pay | Admitting: Nurse Practitioner

## 2024-01-17 DIAGNOSIS — E782 Mixed hyperlipidemia: Secondary | ICD-10-CM

## 2024-01-17 DIAGNOSIS — I10 Essential (primary) hypertension: Secondary | ICD-10-CM

## 2024-01-17 DIAGNOSIS — E1165 Type 2 diabetes mellitus with hyperglycemia: Secondary | ICD-10-CM

## 2024-01-17 DIAGNOSIS — Z7985 Long-term (current) use of injectable non-insulin antidiabetic drugs: Secondary | ICD-10-CM

## 2024-01-17 DIAGNOSIS — Z7984 Long term (current) use of oral hypoglycemic drugs: Secondary | ICD-10-CM

## 2024-01-17 DIAGNOSIS — Z794 Long term (current) use of insulin: Secondary | ICD-10-CM

## 2024-01-17 NOTE — Progress Notes (Signed)
 Erroneous encounter

## 2024-01-29 ENCOUNTER — Ambulatory Visit: Admitting: Neurology

## 2024-02-13 NOTE — Patient Instructions (Signed)

## 2024-02-14 ENCOUNTER — Encounter: Payer: Self-pay | Admitting: Nurse Practitioner

## 2024-02-14 DIAGNOSIS — E782 Mixed hyperlipidemia: Secondary | ICD-10-CM

## 2024-02-14 DIAGNOSIS — Z7984 Long term (current) use of oral hypoglycemic drugs: Secondary | ICD-10-CM

## 2024-02-14 DIAGNOSIS — Z794 Long term (current) use of insulin: Secondary | ICD-10-CM

## 2024-02-14 DIAGNOSIS — Z7985 Long-term (current) use of injectable non-insulin antidiabetic drugs: Secondary | ICD-10-CM

## 2024-02-14 DIAGNOSIS — I10 Essential (primary) hypertension: Secondary | ICD-10-CM

## 2024-02-14 NOTE — Progress Notes (Signed)
 Erroneous encounter

## 2024-05-06 ENCOUNTER — Ambulatory Visit: Admitting: Neurology
